# Patient Record
Sex: Female | Born: 1940 | Race: White | Hispanic: No | Smoking: Never smoker
Health system: Southern US, Community
[De-identification: ages and names within clinical notes are randomized; demographics above are authoritative.]

## PROBLEM LIST (undated history)

## (undated) DIAGNOSIS — E785 Hyperlipidemia, unspecified: Secondary | ICD-10-CM

## (undated) DIAGNOSIS — K5790 Diverticulosis of intestine, part unspecified, without perforation or abscess without bleeding: Secondary | ICD-10-CM

## (undated) DIAGNOSIS — K219 Gastro-esophageal reflux disease without esophagitis: Secondary | ICD-10-CM

## (undated) DIAGNOSIS — Z9289 Personal history of other medical treatment: Secondary | ICD-10-CM

## (undated) DIAGNOSIS — E669 Obesity, unspecified: Secondary | ICD-10-CM

## (undated) DIAGNOSIS — E559 Vitamin D deficiency, unspecified: Secondary | ICD-10-CM

## (undated) DIAGNOSIS — N200 Calculus of kidney: Secondary | ICD-10-CM

## (undated) DIAGNOSIS — I10 Essential (primary) hypertension: Secondary | ICD-10-CM

## (undated) DIAGNOSIS — G8929 Other chronic pain: Secondary | ICD-10-CM

## (undated) DIAGNOSIS — M1990 Unspecified osteoarthritis, unspecified site: Secondary | ICD-10-CM

## (undated) DIAGNOSIS — M25569 Pain in unspecified knee: Secondary | ICD-10-CM

## (undated) HISTORY — DX: Diverticulosis of intestine, part unspecified, without perforation or abscess without bleeding: K57.90

## (undated) HISTORY — DX: Personal history of other medical treatment: Z92.89

## (undated) HISTORY — PX: KIDNEY STONE SURGERY: SHX686

## (undated) HISTORY — DX: Hyperlipidemia, unspecified: E78.5

## (undated) HISTORY — PX: OTHER SURGICAL HISTORY: SHX169

## (undated) HISTORY — DX: Vitamin D deficiency, unspecified: E55.9

## (undated) HISTORY — DX: Obesity, unspecified: E66.9

---

## 2005-11-25 ENCOUNTER — Other Ambulatory Visit: Admission: RE | Admit: 2005-11-25 | Discharge: 2005-11-25 | Payer: Self-pay | Admitting: Family Medicine

## 2005-12-07 ENCOUNTER — Encounter: Admission: RE | Admit: 2005-12-07 | Discharge: 2005-12-07 | Payer: Self-pay | Admitting: Family Medicine

## 2006-08-15 DIAGNOSIS — K5790 Diverticulosis of intestine, part unspecified, without perforation or abscess without bleeding: Secondary | ICD-10-CM

## 2006-08-15 HISTORY — DX: Diverticulosis of intestine, part unspecified, without perforation or abscess without bleeding: K57.90

## 2006-12-11 ENCOUNTER — Encounter: Admission: RE | Admit: 2006-12-11 | Discharge: 2006-12-11 | Payer: Self-pay | Admitting: Family Medicine

## 2007-03-07 ENCOUNTER — Other Ambulatory Visit: Admission: RE | Admit: 2007-03-07 | Discharge: 2007-03-07 | Payer: Self-pay | Admitting: Family Medicine

## 2007-03-14 ENCOUNTER — Encounter: Admission: RE | Admit: 2007-03-14 | Discharge: 2007-03-14 | Payer: Self-pay | Admitting: Family Medicine

## 2007-03-26 ENCOUNTER — Ambulatory Visit: Payer: Self-pay | Admitting: Internal Medicine

## 2007-04-10 ENCOUNTER — Ambulatory Visit: Payer: Self-pay | Admitting: Internal Medicine

## 2007-12-20 ENCOUNTER — Encounter: Admission: RE | Admit: 2007-12-20 | Discharge: 2007-12-20 | Payer: Self-pay | Admitting: Family Medicine

## 2008-04-22 ENCOUNTER — Other Ambulatory Visit: Admission: RE | Admit: 2008-04-22 | Discharge: 2008-04-22 | Payer: Self-pay | Admitting: Family Medicine

## 2008-12-22 ENCOUNTER — Encounter: Admission: RE | Admit: 2008-12-22 | Discharge: 2008-12-22 | Payer: Self-pay | Admitting: Family Medicine

## 2009-02-25 ENCOUNTER — Emergency Department (HOSPITAL_COMMUNITY): Admission: EM | Admit: 2009-02-25 | Discharge: 2009-02-25 | Payer: Self-pay | Admitting: Emergency Medicine

## 2009-10-12 ENCOUNTER — Emergency Department (HOSPITAL_BASED_OUTPATIENT_CLINIC_OR_DEPARTMENT_OTHER): Admission: EM | Admit: 2009-10-12 | Discharge: 2009-10-12 | Payer: Self-pay | Admitting: Emergency Medicine

## 2010-01-01 ENCOUNTER — Encounter: Admission: RE | Admit: 2010-01-01 | Discharge: 2010-01-01 | Payer: Self-pay | Admitting: Family Medicine

## 2010-09-05 ENCOUNTER — Encounter: Payer: Self-pay | Admitting: Family Medicine

## 2010-10-14 DIAGNOSIS — Z9289 Personal history of other medical treatment: Secondary | ICD-10-CM

## 2010-10-14 HISTORY — DX: Personal history of other medical treatment: Z92.89

## 2010-10-18 ENCOUNTER — Other Ambulatory Visit: Payer: Self-pay | Admitting: Family Medicine

## 2010-10-18 DIAGNOSIS — Z1231 Encounter for screening mammogram for malignant neoplasm of breast: Secondary | ICD-10-CM

## 2010-11-22 LAB — URINALYSIS, ROUTINE W REFLEX MICROSCOPIC
Bilirubin Urine: NEGATIVE
Ketones, ur: NEGATIVE mg/dL
Nitrite: NEGATIVE
Specific Gravity, Urine: 1.029 (ref 1.005–1.030)
pH: 6.5 (ref 5.0–8.0)

## 2010-11-22 LAB — URINE MICROSCOPIC-ADD ON

## 2011-01-03 ENCOUNTER — Ambulatory Visit: Payer: Self-pay

## 2011-02-21 ENCOUNTER — Other Ambulatory Visit (HOSPITAL_BASED_OUTPATIENT_CLINIC_OR_DEPARTMENT_OTHER): Payer: Self-pay | Admitting: Family Medicine

## 2011-02-21 DIAGNOSIS — M25561 Pain in right knee: Secondary | ICD-10-CM

## 2011-02-23 ENCOUNTER — Ambulatory Visit (HOSPITAL_BASED_OUTPATIENT_CLINIC_OR_DEPARTMENT_OTHER)
Admission: RE | Admit: 2011-02-23 | Discharge: 2011-02-23 | Disposition: A | Discharge status: Home / Self Care | Payer: Medicare Other | Source: Ambulatory Visit | Attending: Family Medicine | Admitting: Family Medicine

## 2011-02-23 DIAGNOSIS — IMO0002 Reserved for concepts with insufficient information to code with codable children: Secondary | ICD-10-CM | POA: Insufficient documentation

## 2011-02-23 DIAGNOSIS — M25469 Effusion, unspecified knee: Secondary | ICD-10-CM

## 2011-02-23 DIAGNOSIS — M25561 Pain in right knee: Secondary | ICD-10-CM

## 2011-02-23 DIAGNOSIS — M25569 Pain in unspecified knee: Secondary | ICD-10-CM | POA: Insufficient documentation

## 2011-02-23 DIAGNOSIS — M23329 Other meniscus derangements, posterior horn of medial meniscus, unspecified knee: Secondary | ICD-10-CM | POA: Insufficient documentation

## 2011-02-23 DIAGNOSIS — M7120 Synovial cyst of popliteal space [Baker], unspecified knee: Secondary | ICD-10-CM

## 2011-02-23 DIAGNOSIS — M1710 Unilateral primary osteoarthritis, unspecified knee: Secondary | ICD-10-CM | POA: Insufficient documentation

## 2011-04-07 ENCOUNTER — Ambulatory Visit
Admission: RE | Admit: 2011-04-07 | Discharge: 2011-04-07 | Disposition: A | Discharge status: Home / Self Care | Payer: Medicare Other | Source: Ambulatory Visit | Attending: Orthopedic Surgery | Admitting: Orthopedic Surgery

## 2011-04-07 ENCOUNTER — Other Ambulatory Visit: Payer: Self-pay | Admitting: Orthopedic Surgery

## 2011-04-07 ENCOUNTER — Encounter (HOSPITAL_BASED_OUTPATIENT_CLINIC_OR_DEPARTMENT_OTHER)
Admission: RE | Admit: 2011-04-07 | Discharge: 2011-04-07 | Disposition: A | Discharge status: Home / Self Care | Payer: Medicare Other | Source: Ambulatory Visit | Attending: Orthopedic Surgery | Admitting: Orthopedic Surgery

## 2011-04-07 DIAGNOSIS — Z01811 Encounter for preprocedural respiratory examination: Secondary | ICD-10-CM

## 2011-04-07 LAB — BASIC METABOLIC PANEL
BUN: 12 mg/dL (ref 6–23)
CO2: 32 mEq/L (ref 19–32)
Creatinine, Ser: 0.67 mg/dL (ref 0.50–1.10)
Glucose, Bld: 99 mg/dL (ref 70–99)
Sodium: 140 mEq/L (ref 135–145)

## 2011-04-08 ENCOUNTER — Ambulatory Visit (HOSPITAL_BASED_OUTPATIENT_CLINIC_OR_DEPARTMENT_OTHER)
Admission: RE | Admit: 2011-04-08 | Discharge: 2011-04-08 | Disposition: A | Discharge status: Home / Self Care | Payer: Medicare Other | Source: Ambulatory Visit | Attending: Orthopedic Surgery | Admitting: Orthopedic Surgery

## 2011-04-08 DIAGNOSIS — Z01818 Encounter for other preprocedural examination: Secondary | ICD-10-CM | POA: Insufficient documentation

## 2011-04-08 DIAGNOSIS — M2240 Chondromalacia patellae, unspecified knee: Secondary | ICD-10-CM | POA: Insufficient documentation

## 2011-04-08 DIAGNOSIS — I10 Essential (primary) hypertension: Secondary | ICD-10-CM | POA: Insufficient documentation

## 2011-04-08 DIAGNOSIS — Z01812 Encounter for preprocedural laboratory examination: Secondary | ICD-10-CM | POA: Insufficient documentation

## 2011-04-08 DIAGNOSIS — M23305 Other meniscus derangements, unspecified medial meniscus, unspecified knee: Secondary | ICD-10-CM | POA: Insufficient documentation

## 2011-04-08 DIAGNOSIS — M6750 Plica syndrome, unspecified knee: Secondary | ICD-10-CM | POA: Insufficient documentation

## 2011-04-08 LAB — POCT HEMOGLOBIN-HEMACUE: Hemoglobin: 15 g/dL (ref 12.0–15.0)

## 2011-04-21 NOTE — Op Note (Signed)
  NAMENESSA, RAMAKER               ACCOUNT NO.:  [id number]  MEDICAL RECORD NO.:  [id number]  LOCATION:                                 FACILITY:  PHYSICIAN:  Harvie Junior, M.D.   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  PROCEDURE: 1. Partial posterior medial meniscectomy with corresponding     debridement of the medial compartment grade 2 and 3 chondral wear. 2. Debridement of chondromalacia patella grade 2 and 3 chondromalacia. 3. Debridement of medial shelf plica.  SURGEON:  Harvie Junior, MD  ASSISTANT:  Marshia Ly, PA  ANESTHESIA:  General.  BRIEF HISTORY:  Ms. Ohagan is a 70 year old female with a long history of having significant right knee pain.  We treated her conservatively for a long period of time with activity modification, injection therapy, anti-inflammatory medications.  After failing all of these, the patient ultimately underwent MRI, which showed that she had a medial meniscal tear, chondromalacia patella, medial plica.  She was brought to the operating room for debridement of these things. At this point, the patient was placed on the operating table in a supine position.  The right leg was prepped and draped in usual sterile fashion.  Following this, routine arthroscopic evaluation of the knee revealed there was an obvious posterior horn medial meniscal tear at the meniscal root with a large radial tear.  This was debrided back to a smooth and stable rim.  I had to take a fair amount of posterior horn of the meniscus to get it back to a stable position.  The medial femoral condyle was then debrided down to a smooth and stable rim.  Attention was then turned to the ACL, normal  lateral side, had a little bit of defect on the tibial plateau.  This was left alone.  Attention was turned to the patellofemoral joint, which was debrided back to smooth and stable rim.  Large medial shelf plica was taken out at this point. The knee was then copiously and thoroughly lavaged, 6 L of normal saline irrigation suctioned.  All sub portals were closed with bandage.  A sterile anchor compressive dressing was applied and the patient was taken to the recovery room and was noted to be in satisfactory condition.  Estimated blood loss for the procedure was none.     Harvie Junior, M.D.     Ranae Plumber  D:  04/08/2011  T:  04/08/2011  Job:  [id number]  Electronically Signed by Jodi Geralds M.D. on 04/21/2011 08:28:19 AM

## 2011-10-09 ENCOUNTER — Other Ambulatory Visit: Payer: Self-pay

## 2011-10-09 ENCOUNTER — Encounter (HOSPITAL_COMMUNITY): Payer: Self-pay | Admitting: Emergency Medicine

## 2011-10-09 ENCOUNTER — Observation Stay (HOSPITAL_COMMUNITY)
Admission: EM | Admit: 2011-10-09 | Discharge: 2011-10-10 | Disposition: A | Discharge status: Home / Self Care | Payer: Medicare Other | Source: Ambulatory Visit | Attending: Internal Medicine | Admitting: Internal Medicine

## 2011-10-09 ENCOUNTER — Emergency Department (HOSPITAL_COMMUNITY): Payer: Medicare Other

## 2011-10-09 DIAGNOSIS — Z87442 Personal history of urinary calculi: Secondary | ICD-10-CM | POA: Insufficient documentation

## 2011-10-09 DIAGNOSIS — E876 Hypokalemia: Secondary | ICD-10-CM | POA: Diagnosis present

## 2011-10-09 DIAGNOSIS — I10 Essential (primary) hypertension: Secondary | ICD-10-CM | POA: Insufficient documentation

## 2011-10-09 DIAGNOSIS — E785 Hyperlipidemia, unspecified: Secondary | ICD-10-CM | POA: Insufficient documentation

## 2011-10-09 DIAGNOSIS — D696 Thrombocytopenia, unspecified: Secondary | ICD-10-CM | POA: Diagnosis present

## 2011-10-09 DIAGNOSIS — R0789 Other chest pain: Principal | ICD-10-CM | POA: Insufficient documentation

## 2011-10-09 DIAGNOSIS — R079 Chest pain, unspecified: Secondary | ICD-10-CM | POA: Diagnosis present

## 2011-10-09 HISTORY — DX: Essential (primary) hypertension: I10

## 2011-10-09 HISTORY — DX: Calculus of kidney: N20.0

## 2011-10-09 LAB — CBC
Hemoglobin: 13.6 g/dL (ref 12.0–15.0)
MCH: 30.4 pg (ref 26.0–34.0)
MCHC: 33.7 g/dL (ref 30.0–36.0)
MCV: 90 fL (ref 78.0–100.0)
RBC: 4.48 MIL/uL (ref 3.87–5.11)
RDW: 12.9 % (ref 11.5–15.5)

## 2011-10-09 LAB — BASIC METABOLIC PANEL
BUN: 11 mg/dL (ref 6–23)
Chloride: 101 mEq/L (ref 96–112)
GFR calc Af Amer: >90 mL/min (ref >90)
GFR calc non Af Amer: 87 mL/min — ABNORMAL LOW (ref >90)
Glucose, Bld: 90 mg/dL (ref 70–99)

## 2011-10-09 MED ORDER — ENOXAPARIN SODIUM 40 MG/0.4ML ~~LOC~~ SOLN
40.0000 mg | SUBCUTANEOUS | Status: DC
  Administered 2011-10-10: 40 mg via SUBCUTANEOUS
  Filled 2011-10-09: qty 0.4

## 2011-10-09 MED ORDER — POTASSIUM CHLORIDE CRYS ER 20 MEQ PO TBCR
40.0000 meq | EXTENDED_RELEASE_TABLET | Freq: Once | ORAL | Status: AC
  Administered 2011-10-09: 40 meq via ORAL
  Filled 2011-10-09: qty 2

## 2011-10-09 MED ORDER — OMEGA-3 FATTY ACIDS 1000 MG PO CAPS: 1.0000 g | ORAL_CAPSULE | Freq: Every day | ORAL | Status: DC

## 2011-10-09 MED ORDER — AMLODIPINE BESYLATE 5 MG PO TABS
5.0000 mg | ORAL_TABLET | Freq: Every day | ORAL | Status: DC
  Administered 2011-10-10: 5 mg via ORAL
  Filled 2011-10-09: qty 1

## 2011-10-09 MED ORDER — ONDANSETRON HCL 4 MG PO TABS: 4.0000 mg | ORAL_TABLET | Freq: Four times a day (QID) | ORAL | Status: DC | PRN

## 2011-10-09 MED ORDER — NITROGLYCERIN 0.4 MG SL SUBL: 0.4000 mg | SUBLINGUAL_TABLET | SUBLINGUAL | Status: DC | PRN

## 2011-10-09 MED ORDER — ATORVASTATIN CALCIUM 10 MG PO TABS
20.0000 mg | ORAL_TABLET | Freq: Every day | ORAL | Status: DC
  Administered 2011-10-10: 20 mg via ORAL
  Filled 2011-10-09: qty 2

## 2011-10-09 MED ORDER — ONDANSETRON HCL 4 MG/2ML IJ SOLN: 4.0000 mg | Freq: Four times a day (QID) | INTRAMUSCULAR | Status: DC | PRN

## 2011-10-09 MED ORDER — ACETAMINOPHEN 650 MG RE SUPP: 650.0000 mg | Freq: Four times a day (QID) | RECTAL | Status: DC | PRN

## 2011-10-09 MED ORDER — OMEGA-3-ACID ETHYL ESTERS 1 G PO CAPS
1.0000 g | ORAL_CAPSULE | Freq: Every day | ORAL | Status: DC
  Administered 2011-10-10: 1 g via ORAL
  Filled 2011-10-09: qty 1

## 2011-10-09 MED ORDER — SODIUM CHLORIDE 0.9 % IJ SOLN
3.0000 mL | Freq: Two times a day (BID) | INTRAMUSCULAR | Status: DC
  Administered 2011-10-10: 3 mL via INTRAVENOUS

## 2011-10-09 MED ORDER — ASPIRIN EC 325 MG PO TBEC
325.0000 mg | DELAYED_RELEASE_TABLET | Freq: Every day | ORAL | Status: DC
  Administered 2011-10-10: 325 mg via ORAL
  Filled 2011-10-09: qty 1

## 2011-10-09 MED ORDER — ALBUTEROL SULFATE (5 MG/ML) 0.5% IN NEBU: 2.5000 mg | INHALATION_SOLUTION | RESPIRATORY_TRACT | Status: DC | PRN

## 2011-10-09 MED ORDER — ACETAMINOPHEN 325 MG PO TABS: 650.0000 mg | ORAL_TABLET | Freq: Four times a day (QID) | ORAL | Status: DC | PRN

## 2011-10-09 MED ORDER — ALUM & MAG HYDROXIDE-SIMETH 200-200-20 MG/5ML PO SUSP: 30.0000 mL | Freq: Four times a day (QID) | ORAL | Status: DC | PRN

## 2011-10-09 NOTE — ED Notes (Signed)
C/o L sided throbbing chest pain since 7am.  States pain was initially intermittent but is now constant.  Denies sob, nausea, vomiting, or any other symptoms.

## 2011-10-09 NOTE — ED Notes (Addendum)
Pt reports she took 81 mg of ASA this morning.  Pt states that she is under increased stress.  Her mother in currently upstairs on Palliative Care and she lost her husband 3 years ago.

## 2011-10-09 NOTE — ED Provider Notes (Signed)
History     CSN: [id number]  Arrival date & time 10/09/11  1524   First MD Initiated Contact with Patient 10/09/11 1548      Chief Complaint  Patient presents with  . Chest Pain    (Consider location/radiation/quality/duration/timing/severity/associated sxs/prior treatment) Patient is a 71 y.o. female presenting with chest pain. The history is provided by the patient.  Chest Pain    patient here complaining of chest pain that began today. Substernal in nature without radiation. Characterized as being achiness. Denies any associated diaphoresis, nausea vomiting, fever. Nothing makes her symptoms better or worse. No prior history of same. Patient had a cardiac stress test last year per her that was negative. She is oriented to her daily aspirin dose denies any leg pain or swelling, pain has been nonpleuritic  Past Medical History  Diagnosis Date  . Hypertension   . High cholesterol     Past Surgical History  Procedure Date  . Kidney stone surgery     No family history on file.  History  Substance Use Topics  . Smoking status: Never Smoker   . Smokeless tobacco: Not on file  . Alcohol Use: Yes     wine    OB History    Grav Para Term Preterm Abortions TAB SAB Ect Mult Living                  Review of Systems  Cardiovascular: Positive for chest pain.  All other systems reviewed and are negative.    Allergies  Review of patient's allergies indicates no known allergies.  Home Medications   Current Outpatient Rx  Name Route Sig Dispense Refill  . AMLODIPINE BESYLATE 5 MG PO TABS Oral Take 5 mg by mouth daily.    . ASPIRIN EC 81 MG PO TBEC Oral Take 81 mg by mouth daily.    [name] VITAMIN D PO Oral Take 1 tablet by mouth daily.    . OMEGA-3 FATTY ACIDS 1000 MG PO CAPS Oral Take 1 g by mouth daily.    [name] ROSUVASTATIN CALCIUM 10 MG PO TABS Oral Take 10 mg by mouth daily.      BP 139/69  Pulse 84  Temp(Src) 98.7 F (37.1 C) (Oral)  Resp 18  SpO2  96%  Physical Exam  Nursing note and vitals reviewed. Constitutional: She is oriented to person, place, and time. She appears well-developed and well-nourished.  Non-toxic appearance. No distress.  HENT:  Head: Normocephalic and atraumatic.  Eyes: Conjunctivae, EOM and lids are normal. Pupils are equal, round, and reactive to light.  Neck: Normal range of motion. Neck supple. No tracheal deviation present. No mass present.  Cardiovascular: Normal rate, regular rhythm and normal heart sounds.  Exam reveals no gallop.   No murmur heard. Pulmonary/Chest: Effort normal and breath sounds normal. No stridor. No respiratory distress. She has no decreased breath sounds. She has no wheezes. She has no rhonchi. She has no rales.  Abdominal: Soft. Normal appearance and bowel sounds are normal. She exhibits no distension. There is no tenderness. There is no rebound and no CVA tenderness.  Musculoskeletal: Normal range of motion. She exhibits no edema and no tenderness.  Neurological: She is alert and oriented to person, place, and time. She has normal strength. No cranial nerve deficit or sensory deficit. GCS eye subscore is 4. GCS verbal subscore is 5. GCS motor subscore is 6.  Skin: Skin is warm and dry. No abrasion and no rash noted.  Psychiatric: She  has a normal mood and affect. Her speech is normal and behavior is normal.    ED Course  Procedures (including critical care time)   Labs Reviewed  CBC  BASIC METABOLIC PANEL  PROTIME-INR  D-DIMER, QUANTITATIVE   No results found.   No diagnosis found.    MDM   Date: 10/09/2011  Rate: 79  Rhythm: normal sinus rhythm  QRS Axis: right  Intervals: normal  ST/T Wave abnormalities: ST depressions inferiorly  Conduction Disutrbances:none  Narrative Interpretation:   Old EKG Reviewed: none available   8:28 PM Patient to be admitted for evaluation of her chest pain.      Toy Baker, MD 10/09/11 2028

## 2011-10-09 NOTE — ED Notes (Signed)
RN attempted to call report to RN on 2000, told by secretary RN will call back to take report in 5 minutes.

## 2011-10-09 NOTE — H&P (Signed)
PCP:   Cynda Familia, MD, MD   Chief Complaint:  Chest pain  HPI: 71 year old Caucasian female, who looks younger than stated age, with history of hypertension, hyperlipidemia, kidney stones was in her usual state of health until approximately 7:30 this morning. She noticed gradual onset of pain in the epigastric region and slightly to the left of the epigastrium underneath the left rib cage. This was initially rated as 6-61/2 over 10 in severity. This was not associated with dyspnea or palpitations or diaphoresis or cough. There was no radiation of the pain. She felt like this was pain of indigestion. There was no associated nausea or vomiting. She denies pleuritic complement to the pain. She then came to the hospital to visit her mother who is admitted to the palliative care unit and the pain got worse and constant. She then presented to the emergency department. Currently she says the pain is much improved and is intermittent and mild and rates it between 3-4/10 in severity. She indicates that she has had a stress test in March of 2012 at her primary cardiologist office which was negative. She has even had a stress test years before which was negative. She indicates that she is extremely stressed because her mother is dying. There is no history of long distance travel or asymmetrical lower extremity swelling.  Past Medical History: Past Medical History  Diagnosis Date  . Hypertension   . High cholesterol   . Kidney stones     Past Surgical History: Past Surgical History  Procedure Date  . Kidney stone surgery     Allergies:  No Known Allergies  Medications: Prior to Admission medications   Medication Sig Start Date End Date Taking? Authorizing Provider  amLODipine (NORVASC) 5 MG tablet Take 5 mg by mouth daily.   Yes Historical Provider, MD  aspirin EC 81 MG tablet Take 81 mg by mouth daily.   Yes Historical Provider, MD  Cholecalciferol (VITAMIN D PO) Take 1 tablet by mouth  daily.   Yes Historical Provider, MD  fish oil-omega-3 fatty acids 1000 MG capsule Take 1 g by mouth daily.   Yes Historical Provider, MD  rosuvastatin (CRESTOR) 10 MG tablet Take 10 mg by mouth daily.   Yes Historical Provider, MD    Family History: Family History  Problem Relation Age of Onset  . Heart disease Mother     Social History:  reports that she has never smoked. She does not have any smokeless tobacco history on file. She reports that she drinks alcohol. She reports that she does not use illicit drugs. she drinks an occasional glass of wine.  Review of Systems:  All systems reviewed and apart from history of presenting illness are negative.  Physical Exam: Filed Vitals:   10/09/11 1900 10/09/11 1930 10/09/11 2030 10/09/11 2100  BP: 119/66 118/62    Pulse: 72 70 75 78  Temp:      TempSrc:      Resp: 11 13 18 16   SpO2: 91% 95% 96% 94%   General appearance: Moderately built and nourished female patient who is lying comfortably in the gurney and is in no obvious distress.  Head: Nontraumatic and normocephalic.  Eyes: Pupils equally reacting to light and accommodation.  Ears: Normal  Nose: No acute findings. No sinus tenderness.  Throat: Mucosa is moist. No oral thrush.  Neck: Supple. No JVD or carotid bruit. Lymph nodes: No lymphadenopathy.  Resp: Clear to auscultation. No increased work of breathing. No reproducible tenderness on  palpation. Cardio: First and second heart sounds heard, regular rate and rythm. No murmurs or JVD or gallop or pedal edema.   GI: Non distended. Soft and nontender. No organomegaly or masses appreciated. Normal bowel sounds heard.  Extremities: symmetric 5/5 power. No acute findings. Skin: No other acute findings.  Neurologic: Alert and oriented. No focal neurological deficits.   Labs on Admission:   [facility] 10/09/11 1556  NA 141  K 3.1*  CL 101  CO2 28  GLUCOSE 90  BUN 11  CREATININE 0.68  CALCIUM 10.5  MG --  PHOS --   No  results found for this basename: AST:2,ALT:2,ALKPHOS:2,BILITOT:2,PROT:2,ALBUMIN:2 in the last 72 hours No results found for this basename: LIPASE:2,AMYLASE:2 in the last 72 hours  Basename 10/09/11 1556  WBC 7.8  NEUTROABS --  HGB 13.6  HCT 40.3  MCV 90.0  PLT 140*   No results found for this basename: CKTOTAL:3,CKMB:3,CKMBINDEX:3,TROPONINI:3 in the last 72 hours No results found for this basename: TSH,T4TOTAL,FREET3,T3FREE,THYROIDAB in the last 72 hours No results found for this basename: VITAMINB12:2,FOLATE:2,FERRITIN:2,TIBC:2,IRON:2,RETICCTPCT:2 in the last 72 hours  Radiological Exams on Admission: Dg Chest 2 View  10/09/2011  *RADIOLOGY REPORT*  Clinical Data: Left chest pain.  Hypertension.  CHEST - 2 VIEW  Comparison: 04/07/2011  Findings: Stable mild perihilar scarring noted along with thoracic spondylosis.  The lungs appear otherwise clear.  No pleural effusion noted. Heart size is in the upper normal range.  IMPRESSION:  1.  Stable bilateral perihilar scarring. 2.  Thoracic spondylosis.  Original Report Authenticated By: Dellia Cloud, M.D.   EKG: Normal sinus rhythm at 79 beats per minute, rightward axis, nonspecific ST and T wave abnormalities but no acute changes.   Assessment/Plan Present on Admission:  .Chest pain .Hypokalemia .Thrombocytopenia  1. Atypical chest pain: Rule out acute coronary syndrome. Admit to telemetry for 23 hour observation. Cycle cardiac enzymes. Consult [facility] tomorrow regarding further evaluation viz, a repeat stress test versus coronary angiogram versus no further workup. Aspirin 325 mg daily. 2. Hypokalemia: Unclear etiology. Patient is not on any diuretics. Repleted in the ED. Followup BMP and magnesium tomorrow. 3. Thrombocytopenia: Unclear etiology. No prior blood results to compare. Follow CBCs tomorrow. 4. Hypertension: Controlled. 5. Hyperlipidemia: Statins. 6. Full code.   Riona Lahti 10/09/2011,  9:40 PM

## 2011-10-10 ENCOUNTER — Other Ambulatory Visit: Payer: Self-pay

## 2011-10-10 LAB — MAGNESIUM: Magnesium: 2.1 mg/dL (ref 1.5–2.5)

## 2011-10-10 LAB — CBC
HCT: 41.8 % (ref 36.0–46.0)
MCHC: 34.4 g/dL (ref 30.0–36.0)
RDW: 13.2 % (ref 11.5–15.5)

## 2011-10-10 LAB — BASIC METABOLIC PANEL
Calcium: 10 mg/dL (ref 8.4–10.5)
Creatinine, Ser: 0.75 mg/dL (ref 0.50–1.10)
GFR calc Af Amer: >90 mL/min (ref >90)
GFR calc non Af Amer: 84 mL/min — ABNORMAL LOW (ref >90)
Sodium: 143 mEq/L (ref 135–145)

## 2011-10-10 LAB — CARDIAC PANEL(CRET KIN+CKTOT+MB+TROPI)
Relative Index: 1 (ref 0.0–2.5)
Relative Index: 1.4 (ref 0.0–2.5)
Total CK: 207 U/L — ABNORMAL HIGH (ref 7–177)
Total CK: 140 U/L (ref 7–177)

## 2011-10-10 MED ORDER — NITROGLYCERIN 0.4 MG SL SUBL: 0.4000 mg | SUBLINGUAL_TABLET | SUBLINGUAL | Status: DC | PRN

## 2011-10-10 MED ORDER — OMEGA-3-ACID ETHYL ESTERS 1 G PO CAPS: 1.0000 g | ORAL_CAPSULE | Freq: Every day | ORAL | Status: DC

## 2011-10-10 NOTE — Progress Notes (Signed)
Subjective: No further episodes of chest pain No SOB No nausea, no vomiting   Objective: Vital signs in last 24 hours: Filed Vitals:   10/09/11 2130 10/09/11 2231 10/10/11 0531 10/10/11 1021  BP:  120/80 108/72 110/69  Pulse: 77 85 77   Temp:  98.7 F (37.1 C) 98.5 F (36.9 C)   TempSrc:  Oral Oral   Resp: 21 20 19    Height:  5\' 2"  (1.575 m)    Weight:  76.522 kg (168 lb 11.2 oz)    SpO2: 91% 93% 94%    Weight change:   Intake/Output Summary (Last 24 hours) at 10/10/11 1253 Last data filed at 10/10/11 1021  Gross per 24 hour  Intake    243 ml  Output    900 ml  Net   -657 ml    Physical Exam: General: Awake, Oriented, No acute distress. HEENT: EOMI. Neck: Supple CV: S1 and S2, RRR Lungs: Clear to ascultation bilaterally, no wheezing Abdomen: Soft, Nontender, Nondistended, +bowel sounds. Ext: Good pulses. Trace edema.   Lab Results:  [facility] 10/10/11 0610 10/09/11 1556  NA 143 141  K 4.0 3.1*  CL 104 101  CO2 31 28  GLUCOSE 87 90  BUN 11 11  CREATININE 0.75 0.68  CALCIUM 10.0 10.5  MG 2.1 --  PHOS -- --   No results found for this basename: AST:2,ALT:2,ALKPHOS:2,BILITOT:2,PROT:2,ALBUMIN:2 in the last 72 hours No results found for this basename: LIPASE:2,AMYLASE:2 in the last 72 hours  Basename 10/10/11 0610 10/09/11 1556  WBC 6.6 7.8  NEUTROABS -- --  HGB 14.4 13.6  HCT 41.8 40.3  MCV 91.7 90.0  PLT 151 140*    Basename 10/10/11 0610 10/09/11 2227  CKTOTAL 140 207*  CKMB 1.9 2.1  CKMBINDEX -- --  TROPONINI <0.30 <0.30   No components found with this basename: POCBNP:3  Basename 10/09/11 1556  DDIMER 0.39   No results found for this basename: HGBA1C:2 in the last 72 hours No results found for this basename: CHOL:2,HDL:2,LDLCALC:2,TRIG:2,CHOLHDL:2,LDLDIRECT:2 in the last 72 hours No results found for this basename: TSH,T4TOTAL,FREET3,T3FREE,THYROIDAB in the last 72 hours No results found for this basename:  VITAMINB12:2,FOLATE:2,FERRITIN:2,TIBC:2,IRON:2,RETICCTPCT:2 in the last 72 hours  Micro Results: No results found for this or any previous visit (from the past 240 hour(s)).  Studies/Results: Dg Chest 2 View  10/09/2011  *RADIOLOGY REPORT*  Clinical Data: Left chest pain.  Hypertension.  CHEST - 2 VIEW  Comparison: 04/07/2011  Findings: Stable mild perihilar scarring noted along with thoracic spondylosis.  The lungs appear otherwise clear.  No pleural effusion noted. Heart size is in the upper normal range.  IMPRESSION:  1.  Stable bilateral perihilar scarring. 2.  Thoracic spondylosis.  Original Report Authenticated By: Dellia Cloud, M.D.    Medications: I have reviewed the patient's current medications. Scheduled Meds:   . amLODipine  5 mg Oral Daily  . aspirin EC  325 mg Oral Daily  . atorvastatin  20 mg Oral q1800  . enoxaparin  40 mg Subcutaneous Q24H  . omega-3 acid ethyl esters  1 g Oral Daily  . potassium chloride  40 mEq Oral Once  . sodium chloride  3 mL Intravenous Q12H  . DISCONTD: fish oil-omega-3 fatty acids  1 g Oral Daily   Continuous Infusions:  PRN Meds:.acetaminophen, acetaminophen, albuterol, alum & mag hydroxide-simeth, nitroGLYCERIN, ondansetron (ZOFRAN) IV, ondansetron  Assessment/Plan:   *Chest pain- CE negative,    Hypokalemia-repleated   Thrombocytopenia- stable  HTN HLD  Hope for  D/C soon if no intervention by cardiology    LOS: 1 day  [name] Marcum, DO 10/10/2011, 12:53 PM

## 2011-10-10 NOTE — Progress Notes (Signed)
Utilization review complete 

## 2011-10-10 NOTE — Progress Notes (Signed)
1850 - pt d/c home with instructions, r/x, and f/u appointment with PCP. Pt verbalized understanding of instructions, home with family.  [name] [name] 10/10/2011

## 2011-10-10 NOTE — Discharge Summary (Signed)
Discharge Summary  [name] [name] MR#: [id number]  DOB:05-Jan-1941  Date of Admission: 10/09/2011 Date of Discharge: 10/10/2011  Patient's PCP: Cynda Familia, MD, MD  Attending Physician:Judah Chevere  Consults: Treatment Team:  Runell Gess, MD Thurmon Fair, MD   Discharge Diagnoses:  *Chest pain  Hypokalemia  Thrombocytopenia   Brief Admitting History and Physical 71 year old Caucasian female, who looks younger than stated age, with history of hypertension, hyperlipidemia, kidney stones was in her usual state of health until approximately 7:30 this morning. She noticed gradual onset of pain in the epigastric region and slightly to the left of the epigastrium underneath the left rib cage. This was initially rated as 6-61/2 over 10 in severity. This was not associated with dyspnea or palpitations or diaphoresis or cough. There was no radiation of the pain. She felt like this was pain of indigestion. There was no associated nausea or vomiting. She denies pleuritic complement to the pain. She then came to the hospital to visit her mother who is admitted to the palliative care unit and the pain got worse and constant. She then presented to the emergency department. Currently she says the pain is much improved and is intermittent and mild and rates it between 3-4/10 in severity. She indicates that she has had a stress test in March of 2012 at her primary cardiologist office which was negative. She has even had a stress test years before which was negative. She indicates that she is extremely stressed because her mother is dying. There is no history of long distance travel or asymmetrical lower extremity swelling.   Discharge Medications Medication List  As of 10/10/2011  3:58 PM   TAKE these medications         amLODipine 5 MG tablet   Commonly known as: NORVASC   Take 5 mg by mouth daily.      aspirin EC 81 MG tablet   Take 81 mg by mouth daily.      fish oil-omega-3 fatty  acids 1000 MG capsule   Take 1 g by mouth daily.      nitroGLYCERIN 0.4 MG SL tablet   Commonly known as: NITROSTAT   Place 1 tablet (0.4 mg total) under the tongue every 5 (five) minutes x 3 doses as needed for chest pain.      omega-3 acid ethyl esters 1 G capsule   Commonly known as: LOVAZA   Take 1 capsule (1 g total) by mouth daily.      rosuvastatin 10 MG tablet   Commonly known as: CRESTOR   Take 10 mg by mouth daily.      VITAMIN D PO   Take 1 tablet by mouth daily.            Hospital Course: Chest pain- ? Related to stress, seen by cardiology- recent echo and ST normal, plan to D/C home with follow up by PCP/cardiologist Hypokalemia- repleated     Day of Discharge BP 107/65  Pulse 82  Temp(Src) 98.9 F (37.2 C) (Oral)  Resp 18  Ht 5\' 2"  (1.575 m)  Wt 76.522 kg (168 lb 11.2 oz)  BMI 30.86 kg/m2  SpO2 93%  Results for orders placed during the hospital encounter of 10/09/11 (from the past 48 hour(s))  CBC     Status: Abnormal   Collection Time   10/09/11  3:56 PM      Component Value Range Comment   WBC 7.8  4.0 - 10.5 (K/uL)    RBC 4.48  3.87 - 5.11 (MIL/uL)    Hemoglobin 13.6  12.0 - 15.0 (g/dL)    HCT 16.1  09.6 - 04.5 (%)    MCV 90.0  78.0 - 100.0 (fL)    MCH 30.4  26.0 - 34.0 (pg)    MCHC 33.7  30.0 - 36.0 (g/dL)    RDW 40.9  81.1 - 91.4 (%)    Platelets 140 (*) 150 - 400 (K/uL)   BASIC METABOLIC PANEL     Status: Abnormal   Collection Time   10/09/11  3:56 PM      Component Value Range Comment   Sodium 141  135 - 145 (mEq/L)    Potassium 3.1 (*) 3.5 - 5.1 (mEq/L)    Chloride 101  96 - 112 (mEq/L)    CO2 28  19 - 32 (mEq/L)    Glucose, Bld 90  70 - 99 (mg/dL)    BUN 11  6 - 23 (mg/dL)    Creatinine, Ser 7.82  0.50 - 1.10 (mg/dL)    Calcium 95.6  8.4 - 10.5 (mg/dL)    GFR calc non Af Amer 87 (*) >90 (mL/min)    GFR calc Af Amer >90  >90 (mL/min)   PROTIME-INR     Status: Normal   Collection Time   10/09/11  3:56 PM      Component Value  Range Comment   Prothrombin Time 14.2  11.6 - 15.2 (seconds)    INR 1.08  0.00 - 1.49    D-DIMER, QUANTITATIVE     Status: Normal   Collection Time   10/09/11  3:56 PM      Component Value Range Comment   D-Dimer, Quant 0.39  0.00 - 0.48 (ug/mL-FEU)   POCT I-STAT TROPONIN I     Status: Normal   Collection Time   10/09/11  4:27 PM      Component Value Range Comment   Troponin i, poc 0.00  0.00 - 0.08 (ng/mL)    Comment 3            CARDIAC PANEL(CRET KIN+CKTOT+MB+TROPI)     Status: Abnormal   Collection Time   10/09/11 10:27 PM      Component Value Range Comment   Total CK 207 (*) 7 - 177 (U/L)    CK, MB 2.1  0.3 - 4.0 (ng/mL)    Troponin I <0.30  <0.30 (ng/mL)    Relative Index 1.0  0.0 - 2.5    CARDIAC PANEL(CRET KIN+CKTOT+MB+TROPI)     Status: Normal   Collection Time   10/10/11  6:10 AM      Component Value Range Comment   Total CK 140  7 - 177 (U/L)    CK, MB 1.9  0.3 - 4.0 (ng/mL)    Troponin I <0.30  <0.30 (ng/mL)    Relative Index 1.4  0.0 - 2.5    CBC     Status: Normal   Collection Time   10/10/11  6:10 AM      Component Value Range Comment   WBC 6.6  4.0 - 10.5 (K/uL)    RBC 4.56  3.87 - 5.11 (MIL/uL)    Hemoglobin 14.4  12.0 - 15.0 (g/dL)    HCT 21.3  08.6 - 57.8 (%)    MCV 91.7  78.0 - 100.0 (fL)    MCH 31.6  26.0 - 34.0 (pg)    MCHC 34.4  30.0 - 36.0 (g/dL)    RDW 46.9  62.9 - 52.8 (%)  Platelets 151  150 - 400 (K/uL)   BASIC METABOLIC PANEL     Status: Abnormal   Collection Time   10/10/11  6:10 AM      Component Value Range Comment   Sodium 143  135 - 145 (mEq/L)    Potassium 4.0  3.5 - 5.1 (mEq/L)    Chloride 104  96 - 112 (mEq/L)    CO2 31  19 - 32 (mEq/L)    Glucose, Bld 87  70 - 99 (mg/dL)    BUN 11  6 - 23 (mg/dL)    Creatinine, Ser 1.61  0.50 - 1.10 (mg/dL)    Calcium 09.6  8.4 - 10.5 (mg/dL)    GFR calc non Af Amer 84 (*) >90 (mL/min)    GFR calc Af Amer >90  >90 (mL/min)   MAGNESIUM     Status: Normal   Collection Time   10/10/11  6:10 AM        Component Value Range Comment   Magnesium 2.1  1.5 - 2.5 (mg/dL)     Dg Chest 2 View  0/45/4098  *RADIOLOGY REPORT*  Clinical Data: Left chest pain.  Hypertension.  CHEST - 2 VIEW  Comparison: 04/07/2011  Findings: Stable mild perihilar scarring noted along with thoracic spondylosis.  The lungs appear otherwise clear.  No pleural effusion noted. Heart size is in the upper normal range.  IMPRESSION:  1.  Stable bilateral perihilar scarring. 2.  Thoracic spondylosis.  Original Report Authenticated By: Dellia Cloud, M.D.     Disposition: home  Diet: cardiac  Activity: as tolerated  Follow-up Appts: PCP in 1 week  Discharge Orders    Future Orders Please Complete By Expires   Diet - low sodium heart healthy      Increase activity slowly          Time spent on discharge, talking to the patient, and coordinating care: 32 mins.   SignedMarlin Canary, DO 10/10/2011, 3:58 PM

## 2011-10-10 NOTE — Consult Note (Signed)
Reason for Consult: Chest Pain Referring Physician:   [name] is an 71 y.o. female.  HPI: patient is a 71 year old female with a history of hypertension hyperlipidemia and obesity. She is a patient [facility]. She was last seen on 11/29/2010. She had a nuclear stress test in March of 2012 which revealed normal perfusion. Shows a 2-D echocardiogram showed normal systolic function normal diastolic function. Was mild mitral annular calcification with mild MR and mild TR.  Patient states that yesterday morning she developed "indigestion". She continued on to church and after church was visiting her mother up and out of medicine. This indigestion/pain continued with 6-1/2-7/10 in intensity. It was not sharp she describes it "as annoying". She took several TUMS with no relief. She denies any radiation of this pain, any nausea, vomiting, diaphoresis, lower extremity edema, orthopnea, PND, abdominal pain.  Is epigastric/chest pain is resolved currently. She does report that last week she had one episode under her left breast of feeling like "something jumping around".  She just reports that she has started a senior exercise program which she does to the television. This lasts one hour and includes cardiovascular work and weights with dumbbells.  At no time last week did she have any chest pain associated during these exercise periods.  EKG shows inverted T waves V1 and 2.  Past Medical History  Diagnosis Date  . Hypertension   . High cholesterol   . Kidney stones     Past Surgical History  Procedure Date  . Kidney stone surgery   . Orthroscopic rt knee     Family History  Problem Relation Age of Onset  . Heart disease Mother     Social History:  reports that she has never smoked. She has never used smokeless tobacco. She reports that she drinks alcohol. She reports that she does not use illicit drugs.  Allergies: No Known Allergies  Medications:     . amLODipine  5  mg Oral Daily  . aspirin EC  325 mg Oral Daily  . atorvastatin  20 mg Oral q1800  . enoxaparin  40 mg Subcutaneous Q24H  . omega-3 acid ethyl esters  1 g Oral Daily  . potassium chloride  40 mEq Oral Once  . sodium chloride  3 mL Intravenous Q12H  . DISCONTD: fish oil-omega-3 fatty acids  1 g Oral Daily     Results for orders placed during the hospital encounter of 10/09/11 (from the past 48 hour(s))  CBC     Status: Abnormal   Collection Time   10/09/11  3:56 PM      Component Value Range Comment   WBC 7.8  4.0 - 10.5 (K/uL)    RBC 4.48  3.87 - 5.11 (MIL/uL)    Hemoglobin 13.6  12.0 - 15.0 (g/dL)    HCT 09.8  11.9 - 14.7 (%)    MCV 90.0  78.0 - 100.0 (fL)    MCH 30.4  26.0 - 34.0 (pg)    MCHC 33.7  30.0 - 36.0 (g/dL)    RDW 82.9  56.2 - 13.0 (%)    Platelets 140 (*) 150 - 400 (K/uL)   BASIC METABOLIC PANEL     Status: Abnormal   Collection Time   10/09/11  3:56 PM      Component Value Range Comment   Sodium 141  135 - 145 (mEq/L)    Potassium 3.1 (*) 3.5 - 5.1 (mEq/L)    Chloride 101  96 -  112 (mEq/L)    CO2 28  19 - 32 (mEq/L)    Glucose, Bld 90  70 - 99 (mg/dL)    BUN 11  6 - 23 (mg/dL)    Creatinine, Ser 7.84  0.50 - 1.10 (mg/dL)    Calcium 69.6  8.4 - 10.5 (mg/dL)    GFR calc non Af Amer 87 (*) >90 (mL/min)    GFR calc Af Amer >90  >90 (mL/min)   PROTIME-INR     Status: Normal   Collection Time   10/09/11  3:56 PM      Component Value Range Comment   Prothrombin Time 14.2  11.6 - 15.2 (seconds)    INR 1.08  0.00 - 1.49    D-DIMER, QUANTITATIVE     Status: Normal   Collection Time   10/09/11  3:56 PM      Component Value Range Comment   D-Dimer, Quant 0.39  0.00 - 0.48 (ug/mL-FEU)   POCT I-STAT TROPONIN I     Status: Normal   Collection Time   10/09/11  4:27 PM      Component Value Range Comment   Troponin i, poc 0.00  0.00 - 0.08 (ng/mL)    Comment 3            CARDIAC PANEL(CRET KIN+CKTOT+MB+TROPI)     Status: Abnormal   Collection Time   10/09/11 10:27 PM       Component Value Range Comment   Total CK 207 (*) 7 - 177 (U/L)    CK, MB 2.1  0.3 - 4.0 (ng/mL)    Troponin I <0.30  <0.30 (ng/mL)    Relative Index 1.0  0.0 - 2.5    CARDIAC PANEL(CRET KIN+CKTOT+MB+TROPI)     Status: Normal   Collection Time   10/10/11  6:10 AM      Component Value Range Comment   Total CK 140  7 - 177 (U/L)    CK, MB 1.9  0.3 - 4.0 (ng/mL)    Troponin I <0.30  <0.30 (ng/mL)    Relative Index 1.4  0.0 - 2.5    CBC     Status: Normal   Collection Time   10/10/11  6:10 AM      Component Value Range Comment   WBC 6.6  4.0 - 10.5 (K/uL)    RBC 4.56  3.87 - 5.11 (MIL/uL)    Hemoglobin 14.4  12.0 - 15.0 (g/dL)    HCT 29.5  28.4 - 13.2 (%)    MCV 91.7  78.0 - 100.0 (fL)    MCH 31.6  26.0 - 34.0 (pg)    MCHC 34.4  30.0 - 36.0 (g/dL)    RDW 44.0  10.2 - 72.5 (%)    Platelets 151  150 - 400 (K/uL)   BASIC METABOLIC PANEL     Status: Abnormal   Collection Time   10/10/11  6:10 AM      Component Value Range Comment   Sodium 143  135 - 145 (mEq/L)    Potassium 4.0  3.5 - 5.1 (mEq/L)    Chloride 104  96 - 112 (mEq/L)    CO2 31  19 - 32 (mEq/L)    Glucose, Bld 87  70 - 99 (mg/dL)    BUN 11  6 - 23 (mg/dL)    Creatinine, Ser 3.66  0.50 - 1.10 (mg/dL)    Calcium 44.0  8.4 - 10.5 (mg/dL)    GFR calc non Af Amer 84 (*) >  90 (mL/min)    GFR calc Af Amer >90  >90 (mL/min)   MAGNESIUM     Status: Normal   Collection Time   10/10/11  6:10 AM      Component Value Range Comment   Magnesium 2.1  1.5 - 2.5 (mg/dL)     Dg Chest 2 View  4/54/0981  *RADIOLOGY REPORT*  Clinical Data: Left chest pain.  Hypertension.  CHEST - 2 VIEW  Comparison: 04/07/2011  Findings: Stable mild perihilar scarring noted along with thoracic spondylosis.  The lungs appear otherwise clear.  No pleural effusion noted. Heart size is in the upper normal range.  IMPRESSION:  1.  Stable bilateral perihilar scarring. 2.  Thoracic spondylosis.  Original Report Authenticated By: Dellia Cloud, M.D.     Review of Systems  Constitutional: Negative for fever and diaphoresis.  HENT: Negative for congestion and neck pain.   Eyes: Negative for blurred vision and double vision.  Respiratory: Negative for cough and shortness of breath.   Cardiovascular: Positive for chest pain (However it is resolved.). Negative for palpitations, orthopnea, leg swelling and PND.  Gastrointestinal: Negative for nausea, vomiting, abdominal pain, diarrhea, constipation, blood in stool and melena.  Genitourinary: Negative for dysuria and hematuria.  Musculoskeletal: Negative for back pain.  Neurological: Negative for dizziness, weakness and headaches.   Blood pressure 110/69, pulse 77, temperature 98.5 F (36.9 C), temperature source Oral, resp. rate 19, height 5\' 2"  (1.575 m), weight 76.522 kg (168 lb 11.2 oz), SpO2 94.00%. Physical Exam  Constitutional: She is oriented to person, place, and time. She appears well-nourished. No distress.       Obese  HENT:  Head: Normocephalic and atraumatic.  Eyes: Conjunctivae and EOM are normal. Pupils are equal, round, and reactive to light. Left eye exhibits no discharge. No scleral icterus.  Neck: Neck supple. No JVD present.  Cardiovascular: Normal rate and regular rhythm.  Exam reveals no friction rub.   No murmur heard. Respiratory: Effort normal and breath sounds normal. She has no wheezes. She has no rales.  GI: Soft. Bowel sounds are normal. There is no tenderness.  Musculoskeletal: She exhibits no edema.  Lymphadenopathy:    She has no cervical adenopathy.  Neurological: She is alert and oriented to person, place, and time. She exhibits normal muscle tone.  Skin: Skin is warm and dry.  Psychiatric: She has a normal mood and affect.    Assessment/Plan: Patient Active Hospital Problem List: Chest pain (10/09/2011) Hypokalemia (10/09/2011) Thrombocytopenia (10/09/2011)  Plan: Patient is a 71 year old Caucasian female with mild obesity, hypertension,  hyperlipidemia who in March of 2012 normal stress test and essentially normal 2-D echocardiogram. She presented with epigastric pain/chest pain.  Cardiac enzymes and a negative. EKG shows inverted T waves in V1 and V2 only.she is currently pain-free.  The pressures were well-controlled at 107/65 with a heart rate of 82 beats per minute.  Potassium has been repleted.  I believe this chest/epigastric pain is most likely noncardiac given normal exercise Myoview within the last 12 months and essentially normal 2-D echocardiogram.  She'll likely be discharged home later today   HAGER,BRYAN W 10/10/2011, 1:11 PM    Patient seen and examined. Agree with assessment and plan. Pt currently is asymptomatic.  She has been under increased stress with mother in palliative care. Question musculoskeletal discomfort with nl CKMB but minimal total CK increase x1. Suspect noncardiac cp.  ECG with non-diagnostic T changes V1-2.  Ok to send home today with ov f/u.  Lennette Bihari, MD, [facility] 10/10/2011 6:20 PM

## 2011-10-11 NOTE — Progress Notes (Signed)
   CARE MANAGEMENT NOTE 10/11/2011  Patient:  [name] [name]   Account Number:  [id number]  Date Initiated:  10/11/2011  Documentation initiated by:  Letha Cape  Subjective/Objective Assessment:   dx chest pain  admit as observation     Action/Plan:   Anticipated DC Date:  10/10/2011   Anticipated DC Plan:  HOME/SELF CARE      DC Planning Services  CM consult      Choice offered to / List presented to:             Status of service:  Completed, signed off Medicare Important Message given?   (If response is "NO", the following Medicare IM given date fields will be blank) Date Medicare IM given:   Date Additional Medicare IM given:    Discharge Disposition:  HOME/SELF CARE  Per UR Regulation:    Comments:  PCP [name] [name]  10/11/11 9:07 Letha Cape RN, BSN [phone] patient lives alone, pta independent.  Patient for dc today to home.

## 2011-12-05 ENCOUNTER — Other Ambulatory Visit: Payer: Self-pay | Admitting: Family Medicine

## 2011-12-05 DIAGNOSIS — Z1231 Encounter for screening mammogram for malignant neoplasm of breast: Secondary | ICD-10-CM

## 2011-12-27 ENCOUNTER — Ambulatory Visit
Admission: RE | Admit: 2011-12-27 | Discharge: 2011-12-27 | Disposition: A | Discharge status: Home / Self Care | Payer: Medicare Other | Source: Ambulatory Visit | Attending: Family Medicine | Admitting: Family Medicine

## 2011-12-27 DIAGNOSIS — Z1231 Encounter for screening mammogram for malignant neoplasm of breast: Secondary | ICD-10-CM

## 2012-02-07 ENCOUNTER — Emergency Department (HOSPITAL_BASED_OUTPATIENT_CLINIC_OR_DEPARTMENT_OTHER)
Admission: EM | Admit: 2012-02-07 | Discharge: 2012-02-07 | Disposition: A | Discharge status: Home / Self Care | Payer: Medicare Other | Attending: Emergency Medicine | Admitting: Emergency Medicine

## 2012-02-07 ENCOUNTER — Encounter (HOSPITAL_BASED_OUTPATIENT_CLINIC_OR_DEPARTMENT_OTHER): Payer: Self-pay | Admitting: *Deleted

## 2012-02-07 ENCOUNTER — Emergency Department (HOSPITAL_BASED_OUTPATIENT_CLINIC_OR_DEPARTMENT_OTHER): Payer: Medicare Other

## 2012-02-07 DIAGNOSIS — E876 Hypokalemia: Secondary | ICD-10-CM | POA: Insufficient documentation

## 2012-02-07 DIAGNOSIS — I10 Essential (primary) hypertension: Secondary | ICD-10-CM | POA: Insufficient documentation

## 2012-02-07 DIAGNOSIS — R0989 Other specified symptoms and signs involving the circulatory and respiratory systems: Secondary | ICD-10-CM | POA: Insufficient documentation

## 2012-02-07 DIAGNOSIS — Z7982 Long term (current) use of aspirin: Secondary | ICD-10-CM | POA: Insufficient documentation

## 2012-02-07 DIAGNOSIS — Z79899 Other long term (current) drug therapy: Secondary | ICD-10-CM | POA: Insufficient documentation

## 2012-02-07 DIAGNOSIS — R42 Dizziness and giddiness: Secondary | ICD-10-CM | POA: Insufficient documentation

## 2012-02-07 DIAGNOSIS — E7800 Pure hypercholesterolemia, unspecified: Secondary | ICD-10-CM | POA: Insufficient documentation

## 2012-02-07 DIAGNOSIS — Z87442 Personal history of urinary calculi: Secondary | ICD-10-CM | POA: Insufficient documentation

## 2012-02-07 LAB — BASIC METABOLIC PANEL
CO2: 30 mEq/L (ref 19–32)
Calcium: 9.7 mg/dL (ref 8.4–10.5)
Creatinine, Ser: 0.7 mg/dL (ref 0.50–1.10)
GFR calc non Af Amer: 85 mL/min — ABNORMAL LOW (ref >90)
Sodium: 141 mEq/L (ref 135–145)

## 2012-02-07 LAB — DIFFERENTIAL
Basophils Absolute: 0 10*3/uL (ref 0.0–0.1)
Eosinophils Absolute: 0.1 10*3/uL (ref 0.0–0.7)
Eosinophils Relative: 2 % (ref 0–5)
Lymphocytes Relative: 27 % (ref 12–46)
Lymphs Abs: 2.3 10*3/uL (ref 0.7–4.0)
Neutrophils Relative %: 64 % (ref 43–77)

## 2012-02-07 LAB — CBC
MCV: 89.4 fL (ref 78.0–100.0)
Platelets: 151 10*3/uL (ref 150–400)
RBC: 4.61 MIL/uL (ref 3.87–5.11)
RDW: 12.8 % (ref 11.5–15.5)
WBC: 8.6 10*3/uL (ref 4.0–10.5)

## 2012-02-07 LAB — CARDIAC PANEL(CRET KIN+CKTOT+MB+TROPI)
CK, MB: 3.6 ng/mL (ref 0.3–4.0)
Relative Index: 1.3 (ref 0.0–2.5)
Relative Index: 1.3 (ref 0.0–2.5)
Total CK: 269 U/L — ABNORMAL HIGH (ref 7–177)

## 2012-02-07 MED ORDER — POTASSIUM CHLORIDE CRYS ER 20 MEQ PO TBCR
40.0000 meq | EXTENDED_RELEASE_TABLET | Freq: Once | ORAL | Status: AC
  Administered 2012-02-07: 40 meq via ORAL
  Filled 2012-02-07 (×2): qty 2

## 2012-02-07 NOTE — ED Provider Notes (Addendum)
History     CSN: [id number]  Arrival date & time 02/07/12  1857   First MD Initiated Contact with Patient 02/07/12 1931      Chief Complaint  Patient presents with  . Dizziness    (Consider location/radiation/quality/duration/timing/severity/associated sxs/prior treatment) Patient is a 71 y.o. female presenting with weakness. The history is provided by the patient.  Weakness The primary symptoms include dizziness. Primary symptoms do not include syncope, visual change, paresthesias, focal weakness, speech change, fever, nausea or vomiting. The symptoms began less than 1 hour ago (first episode yesterday and then another episode today). The episode lasted 1 minute. The symptoms are resolved. The neurological symptoms are diffuse. Context: one episode while standing the other while sitting.  Dizziness does not occur with nausea, vomiting or weakness.  Additional symptoms do not include weakness, loss of balance, anxiety or irritability. Medical issues also include hypertension. Medical issues do not include diabetes.    Past Medical History  Diagnosis Date  . Hypertension   . High cholesterol   . Kidney stones     Past Surgical History  Procedure Date  . Kidney stone surgery   . Orthroscopic rt knee     Family History  Problem Relation Age of Onset  . Heart disease Mother     History  Substance Use Topics  . Smoking status: Never Smoker   . Smokeless tobacco: Never Used  . Alcohol Use: Yes     wine    OB History    Grav Para Term Preterm Abortions TAB SAB Ect Mult Living                  Review of Systems  Constitutional: Negative for fever and irritability.  Cardiovascular: Negative for syncope.  Gastrointestinal: Negative for nausea and vomiting.  Neurological: Positive for dizziness. Negative for speech change, focal weakness, weakness, paresthesias and loss of balance.  All other systems reviewed and are negative.    Allergies  Review of patient's  allergies indicates no known allergies.  Home Medications   Current Outpatient Rx  Name Route Sig Dispense Refill  . AMLODIPINE BESYLATE 5 MG PO TABS Oral Take 5 mg by mouth daily.    [name] VITAMIN D PO Oral Take 1 tablet by mouth daily.    [name] HYDROCHLOROTHIAZIDE 25 MG PO TABS Oral Take 25 mg by mouth daily.    [name] NITROGLYCERIN 0.4 MG SL SUBL Sublingual Place 0.4 mg under the tongue every 5 (five) minutes x 3 doses as needed. Patient hasn't taken this medication in the last 30 days.    [name] ROSUVASTATIN CALCIUM 10 MG PO TABS Oral Take 10 mg by mouth daily.    . ASPIRIN EC 81 MG PO TBEC Oral Take 81 mg by mouth daily.      BP 117/68  Pulse 91  Temp 98.2 F (36.8 C) (Oral)  Resp 18  SpO2 96%  Physical Exam  Nursing note and vitals reviewed. Constitutional: She is oriented to person, place, and time. She appears well-developed and well-nourished. No distress.  HENT:  Head: Normocephalic and atraumatic.  Eyes: EOM are normal. Pupils are equal, round, and reactive to light.  Neck: Normal range of motion. Neck supple. Carotid bruit is present.  Cardiovascular: Normal rate, regular rhythm, normal heart sounds and intact distal pulses.  Exam reveals no friction rub.   No murmur heard. Pulmonary/Chest: Effort normal and breath sounds normal. She has no wheezes. She has no rales.  Abdominal: Soft. Bowel sounds are  normal. She exhibits no distension. There is no tenderness. There is no rebound and no guarding.  Musculoskeletal: Normal range of motion. She exhibits no tenderness.       No edema  Lymphadenopathy:    She has no cervical adenopathy.  Neurological: She is alert and oriented to person, place, and time. No cranial nerve deficit.  Skin: Skin is warm and dry. No rash noted.  Psychiatric: She has a normal mood and affect. Her behavior is normal.    ED Course  Procedures (including critical care time)  Labs Reviewed  BASIC METABOLIC PANEL - Abnormal; Notable for the following:     Potassium 3.0 (*)     GFR calc non Af Amer 85 (*)     All other components within normal limits  CARDIAC PANEL(CRET KIN+CKTOT+MB+TROPI) - Abnormal; Notable for the following:    Total CK 269 (*)     All other components within normal limits  CARDIAC PANEL(CRET KIN+CKTOT+MB+TROPI) - Abnormal; Notable for the following:    Total CK 271 (*)     All other components within normal limits  CBC  DIFFERENTIAL   Dg Chest 2 View  02/07/2012  *RADIOLOGY REPORT*  Clinical Data: Dizziness.  CHEST - 2 VIEW  Comparison: 10/09/2011  Findings: Normal heart size and pulmonary vascularity.  Linear scarring in the mid and lower lungs bilaterally.  This is stable. No focal airspace consolidation.  No blunting of costophrenic angles.  No pneumothorax.  Degenerative changes in the spine. Mildly tortuous aorta.  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  No evidence of active pulmonary disease.  No significant interval change.  Original Report Authenticated By: Marlon Pel, M.D.     Date: 02/07/2012  Rate: 73  Rhythm: normal sinus rhythm  QRS Axis: normal  Intervals: normal  ST/T Wave abnormalities: normal  Conduction Disutrbances: none  Narrative Interpretation: unremarkable     1. Dizziness   2. Hypokalemia       MDM   Patient with with episodes of dizziness that she describes as vertigo where she is spinning and then states she feels like she may pass out. She has had 2 episodes in the last 24 hours. During his episode she has no chest pain or shortness of breath. She denies palpitations. No recent medication changes and otherwise feels normally. Her EKG is within normal limits. She has no heart history. Doubt this is cardiac in nature and she has never syncopized and she denies any palpitations or chest pain during these episodes. There is no history suggestive of stroke she has normal speech in no focal weakness. She's not had any ataxia and episodes only last for 1 minute or less. She has  not had any episodes here but there is no signs of ectopy on her EKG.   Chest x-ray is unchanged. CBC and cardiac markers at 0 and 3 hours are normal. BMP shows mild hypokalemia of 3.0 which is most likely coming from her hydrochlorothiazide. Patient was replaced and this was discussed with her. She will followup with her PCP for possible Holter monitor to ensure that she's not having dysrhythmia causing symptoms.       Gwyneth Sprout, MD 02/07/12 [id number]  Gwyneth Sprout, MD 02/07/12 [phone]

## 2012-02-07 NOTE — ED Notes (Signed)
Yesterday and today had dizziness and lightheadedness.

## 2012-02-07 NOTE — ED Notes (Signed)
Pt c/o feeling dizzy and flush for two day

## 2012-02-07 NOTE — Discharge Instructions (Signed)
Dizziness Dizziness is a common problem. It is a feeling of unsteadiness or lightheadedness. You may feel like you are about to faint. Dizziness can lead to injury if you stumble or fall. A person of any age group can suffer from dizziness, but dizziness is more common in older adults. CAUSES  Dizziness can be caused by many different things, including:  Middle ear problems.   Standing for too long.   Infections.   An allergic reaction.   Aging.   An emotional response to something, such as the sight of blood.   Side effects of medicines.   Fatigue.   Problems with circulation or blood pressure.   Excess use of alcohol, medicines, or illegal drug use.   Breathing too fast (hyperventilation).   An arrhythmia or problems with your heart rhythm.   Low red blood cell count (anemia).   Pregnancy.   Vomiting, diarrhea, fever, or other illnesses that cause dehydration.   Diseases or conditions such as Parkinson's disease, high blood pressure (hypertension), diabetes, and thyroid problems.   Exposure to extreme heat.  DIAGNOSIS  To find the cause of your dizziness, your caregiver may do a physical exam, lab tests, radiologic imaging scans, or an electrocardiography test (ECG).  TREATMENT  Treatment of dizziness depends on the cause of your symptoms and can vary greatly. HOME CARE INSTRUCTIONS   Drink enough fluids to keep your urine clear or pale yellow. This is especially important in very hot weather. In the elderly, it is also important in cold weather.   If your dizziness is caused by medicines, take them exactly as directed. When taking blood pressure medicines, it is especially important to get up slowly.   Rise slowly from chairs and steady yourself until you feel okay.   In the morning, first sit up on the side of the bed. When this seems okay, stand slowly while holding onto something until you know your balance is fine.   If you need to stand in one place for a  long time, be sure to move your legs often. Tighten and relax the muscles in your legs while standing.   If dizziness continues to be a problem, have someone stay with you for a day or two. Do this until you feel you are well enough to stay alone. Have the person call your caregiver if he or she notices changes in you that are concerning.   Do not drive or use heavy machinery if you feel dizzy.  SEEK IMMEDIATE MEDICAL CARE IF:   Your dizziness or lightheadedness gets worse.   You feel nauseous or vomit.   You develop problems with talking, walking, weakness, or using your arms, hands, or legs.   You are not thinking clearly or you have difficulty forming sentences. It may take a friend or family member to determine if your thinking is normal.   You develop chest pain, abdominal pain, shortness of breath, or sweating.   Your vision changes.   You notice any bleeding.   You have side effects from medicine that seems to be getting worse rather than better.  MAKE SURE YOU:   Understand these instructions.   Will watch your condition.   Will get help right away if you are not doing well or get worse.  Document Released: 01/25/2001 Document Revised: 07/21/2011 Document Reviewed: 02/18/2011 [facility] Patient Information 2012 [place], [place].Cardiac Event Monitoring A cardiac event monitor is a small recording device used to help detect abnormal heart rhythms. When the  heart does not beat in a stable or regular pattern, this is called an arrhythmia. An arrhythmia may or may not be felt. Palpitations, or fast heart beats, may be felt. A cardiac event monitor is used to record the heart rhythm when noticeable symptoms such as the following occur:  Palpitations, such as heart racing or fluttering.   Dizziness.   Fainting or lightheadedness.   Unexplained weakness.  The monitor is wired to two electrodes placed on your chest. Electrodes are flat sticky disks that attach to your skin. The  monitor stores the heart rhythm and can be worn for up to 30 days. You will wear the monitor at all times, except when bathing. When you feel symptoms of heart trouble, such as dizziness, weakness, lightheadedness, palpitations, "thumping," shortness of breath or a fluttering or racing heart, you will push a button on the monitor to record the heart rhythm.  Your caregiver will also ask you to keep a diary of your activities, such as walking, doing chores and taking medicine. Be sure to note what you are doing when you experience heart symptoms. This will help your caregiver determine what might be contributing to your symptoms. The information stored in your monitor will be reviewed by your caregiver alongside your diary entries.  LET YOUR CAREGIVER KNOW ABOUT: Allergies to any kind of medical tape. PROCEDURE  A technician will prepare your chest for the electrode placement. The technician will show you how to place the electrodes, how to work the monitor, how to replace the batteries and how to fill in your diary. Take time to practice using the monitor before you leave the office. Make sure you understand how to send the information from the monitor to your doctor. This requires a telephone with a land line, not a cellphone.   If you start to feel lightheaded, dizzy, weak or have fluttering or racing sensations in your heart, press the record button. The monitor is always on and records what happened slightly before you pressed the button, so do not worry about being too late to get good information.  HOME CARE INSTRUCTIONS   Wear your monitor at all times, except when you are in water:   Do not get the monitor wet.   Take the monitor off when bathing, do not swim or use a hot tub with it on.   Keep your skin clean, do not put body lotion or moisturizer on your chest.   It's possible that your skin under the electrodes could become irritated. To keep this from happening, try to put the  electrodes in slightly different places on your chest. However, they must remain in the area under your left breast and in the upper right section of your chest.   Change the electrodes daily or any time they stop sticking to your skin. You might need to use tape to keep them on.   Make sure the monitor is safely clipped to your clothing or in a location close to your body that your caregiver recommends.   Keep a diary of your activities, especially what you were doing when you pushed the button to record your heart symptoms.   Change the batteries as recommended by your caregiver.   Send the recorded information as recommended by your caregiver. It is important to understand that your caregiver does not have instantaneous results when a recording is made.  SEEK MEDICAL CARE IF:   Your doctor reviews your records and advises you to come to  the office or go to the ER.   You have questions or concerns about the cardiac event monitor.  SEEK IMMEDIATE MEDICAL CARE IF:   You have chest pain.   You have difficulty breathing or shortness of breath.   You develop a very fast heartbeat or your heart "skips" beats.   You develop dizziness which lasts several minutes.   You faint or feel you are about to faint.  MAKE SURE YOU:   Understand these instructions.   Will watch your condition.   Will get help right away if you are not doing well or get worse.  Document Released: 05/10/2008 Document Revised: 07/21/2011 Document Reviewed: 05/10/2008 [facility] Patient Information 2012 [place], [place].Foods Rich in Potassium Food / Potassium (mg)  Apricots, dried,  cup / 378 mg   Apricots, raw, 1 cup halves / 401 mg   Avocado,  / 487 mg   Banana, 1 large / 487 mg   Beef, lean, round, 3 oz / 202 mg   Cantaloupe, 1 cup cubes / 427 mg   Dates, medjool, 5 whole / 835 mg   Ham, cured, 3 oz / 212 mg   Lentils, dried,  cup / 458 mg   Lima beans, frozen,  cup / 258 mg   Orange, 1  large / 333 mg   Orange juice, 1 cup / 443 mg   Peaches, dried,  cup / 398 mg   Peas, split, cooked,  cup / 355 mg   Potato, boiled, 1 medium / 515 mg   Prunes, dried, uncooked,  cup / 318 mg   Raisins,  cup / 309 mg   Salmon, pink, raw, 3 oz / 275 mg   Sardines, canned , 3 oz / 338 mg   Tomato, raw, 1 medium / 292 mg   Tomato juice, 6 oz / 417 mg   [place], 3 oz / 349 mg  Document Released: 08/01/2005 Document Revised: 04/13/2011 Document Reviewed: 12/15/2008 [facility] Patient Information 2012 [place] [address], [place].

## 2012-07-16 ENCOUNTER — Telehealth: Payer: Self-pay | Admitting: Internal Medicine

## 2012-07-16 NOTE — Telephone Encounter (Signed)
Pt had blood in her stool. Pt scheduled to see Dr. Marina Goodell 07/27/12@9 :Beverlee Nims to notify pt of appt date and time. Spoke with medical records and requested labs and OV notes from Dr. Izola Price.

## 2012-07-27 ENCOUNTER — Ambulatory Visit: Payer: Medicare Other | Admitting: Internal Medicine

## 2012-08-06 ENCOUNTER — Ambulatory Visit (INDEPENDENT_AMBULATORY_CARE_PROVIDER_SITE_OTHER): Payer: Medicare Other | Admitting: Internal Medicine

## 2012-08-06 ENCOUNTER — Encounter: Payer: Self-pay | Admitting: Internal Medicine

## 2012-08-06 VITALS — BP 120/72 | HR 68 | Ht 62.0 in | Wt 182.2 lb

## 2012-08-06 DIAGNOSIS — K5730 Diverticulosis of large intestine without perforation or abscess without bleeding: Secondary | ICD-10-CM

## 2012-08-06 DIAGNOSIS — R195 Other fecal abnormalities: Secondary | ICD-10-CM

## 2012-08-06 MED ORDER — MOVIPREP 100 G PO SOLR: 1.0000 | Freq: Once | ORAL | Status: DC

## 2012-08-06 NOTE — Patient Instructions (Addendum)
You have been scheduled for a colonoscopy with propofol. Please follow written instructions given to you at your visit today.  Please pick up your prep kit at the pharmacy within the next 1-3 days. If you use inhalers (even only as needed) or a CPAP machine, please bring them with you on the day of your procedure.  CC: Dr Joycelyn Rua

## 2012-08-06 NOTE — Progress Notes (Signed)
HISTORY OF PRESENT ILLNESS:  [name] [name] is a 71 y.o. female with the below listed medical history who is sent today regarding Hemoccult-positive stool. The patient was seen previously in August of 2008 for routine screening colonoscopy. The examination was normal except for sigmoid diverticulosis. Routine followup in 10 years recommended. She has not been seen since. She recently underwent routine annual evaluation with her primary provider (reviewed). Hemoccult studies returned positive. Review of other blood work is unremarkable including hemoglobin of 14.3 and MCV of 92.5. Her GI review of systems is entirely negative. She denies melena or hematochezia. No interval family history of colon cancer.  REVIEW OF SYSTEMS:  All non-GI ROS negative except for  Past Medical History  Diagnosis Date  . Hypertension   . Hyperlipidemia   . Kidney stones   . Diverticulosis 2008  . Obesity   . Vitamin D deficiency     Past Surgical History  Procedure Date  . Kidney stone surgery   . Orthroscopic rt knee     Social History [name] [name]  reports that she has never smoked. She has never used smokeless tobacco. She reports that she drinks alcohol. She reports that she does not use illicit drugs.  family history includes Dementia in her mother; Heart disease in her maternal grandmother and mother; Lung cancer in her father; and Stroke in her maternal grandmother.  No Known Allergies     PHYSICAL EXAMINATION:  Vital signs: BP 120/72  Pulse 68  Ht 5\' 2"  (1.575 m)  Wt 182 lb 3.2 oz (82.645 kg)  BMI 33.32 kg/m2 General: Well-developed, well-nourished, no acute distress HEENT: Sclerae are anicteric, conjunctiva pink. Oral mucosa intact Lungs: Clear Heart: Regular Abdomen: soft, nontender, nondistended, no obvious ascites, no peritoneal signs, normal bowel sounds. No organomegaly. Extremities: No edema Psychiatric: alert and oriented x3. Cooperative    ASSESSMENT:  #1. Asymptomatic  Hemoccult-positive stool. Negative GI review of systems. Normal hemoglobin. #2. Colonoscopy 2008 with diverticulosis   PLAN:  #1. Colonoscopy to rule out significant mucosal pathology.The nature of the procedure, as well as the risks, benefits, and alternatives were carefully and thoroughly reviewed with the patient. Ample time for discussion and questions allowed. The patient understood, was satisfied, and agreed to proceed. Movi prep prescribed. The patient instructed on its use

## 2012-08-21 ENCOUNTER — Ambulatory Visit (AMBULATORY_SURGERY_CENTER): Payer: Medicare Other | Admitting: Internal Medicine

## 2012-08-21 ENCOUNTER — Encounter: Payer: Self-pay | Admitting: Internal Medicine

## 2012-08-21 VITALS — BP 106/71 | HR 62 | Temp 97.8°F | Resp 30 | Ht 62.0 in | Wt 182.2 lb

## 2012-08-21 DIAGNOSIS — K5730 Diverticulosis of large intestine without perforation or abscess without bleeding: Secondary | ICD-10-CM

## 2012-08-21 DIAGNOSIS — R195 Other fecal abnormalities: Secondary | ICD-10-CM

## 2012-08-21 MED ORDER — SODIUM CHLORIDE 0.9 % IV SOLN: 500.0000 mL | INTRAVENOUS | Status: DC

## 2012-08-21 NOTE — Progress Notes (Signed)
Patient did not experience any of the following events: a burn prior to discharge; a fall within the facility; wrong site/side/patient/procedure/implant event; or a hospital transfer or hospital admission upon discharge from the facility. (G8907) Patient did not have preoperative order for IV antibiotic SSI prophylaxis. (G8918)  

## 2012-08-21 NOTE — Progress Notes (Signed)
Propofol given over incremental dosages 

## 2012-08-21 NOTE — Op Note (Signed)
 [facility] [address] [place], [address]   COLONOSCOPY PROCEDURE REPORT  PATIENT: [name], [name]  MR#: [id number] BIRTHDATE: 1941-02-10 , 71  yrs. old GENDER: Female ENDOSCOPIST: Roxy Cedar, MD REFERRED [id number] Lenise Arena, M.D. PROCEDURE DATE:  08/21/2012 PROCEDURE:   Colonoscopy, diagnostic ASA CLASS:   Class II INDICATIONS:heme-positive stool (asymptomatic; normal Hg); screening colonoscopy 2008 (tics only). MEDICATIONS: MAC sedation, administered by CRNA and propofol (Diprivan) 80mg  IV  DESCRIPTION OF PROCEDURE:   After the risks benefits and alternatives of the procedure were thoroughly explained, informed consent was obtained.  A digital rectal exam revealed no abnormalities of the rectum.   The LB [id number]  endoscope was introduced through the anus and advanced to the cecum, which was identified by both the appendix and ileocecal valve. No adverse events experienced.   The quality of the prep was excellent, using MoviPrep  The instrument was then slowly withdrawn as the colon was fully examined.      COLON FINDINGS: Moderate diverticulosis was noted in the sigmoid colon.   The colon was otherwise normal.  There was no inflammation, polyps or cancers .  Retroflexed views revealed internal hemorrhoids. The time to cecum=2 minutes 48 seconds. Withdrawal time=9 minutes 15 seconds.  The scope was withdrawn and the procedure completed. COMPLICATIONS: There were no complications.  ENDOSCOPIC IMPRESSION: 1.   Moderate diverticulosis was noted in the sigmoid colon 2.   The colon was otherwise normal  RECOMMENDATIONS: 1. Continue current colorectal screening recommendations for "routine risk" patients with a repeat colonoscopy in 10 years.   eSigned:  Roxy Cedar, MD 08/21/2012 12:42 PM   cc: Joycelyn Rua, MD and The Patient

## 2012-08-21 NOTE — Patient Instructions (Addendum)

## 2012-08-22 ENCOUNTER — Telehealth: Payer: Self-pay | Admitting: *Deleted

## 2012-08-22 NOTE — Telephone Encounter (Signed)
No answer. Number identifier. Message left to call if questions or concerns. 

## 2012-11-05 ENCOUNTER — Emergency Department (HOSPITAL_BASED_OUTPATIENT_CLINIC_OR_DEPARTMENT_OTHER): Payer: Medicare Other

## 2012-11-05 ENCOUNTER — Emergency Department (HOSPITAL_BASED_OUTPATIENT_CLINIC_OR_DEPARTMENT_OTHER)
Admission: EM | Admit: 2012-11-05 | Discharge: 2012-11-05 | Disposition: A | Discharge status: Home / Self Care | Payer: Medicare Other | Attending: Emergency Medicine | Admitting: Emergency Medicine

## 2012-11-05 ENCOUNTER — Encounter (HOSPITAL_BASED_OUTPATIENT_CLINIC_OR_DEPARTMENT_OTHER): Payer: Self-pay

## 2012-11-05 DIAGNOSIS — M545 Low back pain, unspecified: Secondary | ICD-10-CM | POA: Insufficient documentation

## 2012-11-05 DIAGNOSIS — Z87442 Personal history of urinary calculi: Secondary | ICD-10-CM | POA: Insufficient documentation

## 2012-11-05 DIAGNOSIS — R52 Pain, unspecified: Secondary | ICD-10-CM | POA: Insufficient documentation

## 2012-11-05 DIAGNOSIS — K219 Gastro-esophageal reflux disease without esophagitis: Secondary | ICD-10-CM | POA: Insufficient documentation

## 2012-11-05 DIAGNOSIS — E559 Vitamin D deficiency, unspecified: Secondary | ICD-10-CM | POA: Insufficient documentation

## 2012-11-05 DIAGNOSIS — Z8719 Personal history of other diseases of the digestive system: Secondary | ICD-10-CM | POA: Insufficient documentation

## 2012-11-05 DIAGNOSIS — E785 Hyperlipidemia, unspecified: Secondary | ICD-10-CM | POA: Insufficient documentation

## 2012-11-05 DIAGNOSIS — I10 Essential (primary) hypertension: Secondary | ICD-10-CM | POA: Insufficient documentation

## 2012-11-05 DIAGNOSIS — Z79899 Other long term (current) drug therapy: Secondary | ICD-10-CM | POA: Insufficient documentation

## 2012-11-05 DIAGNOSIS — N390 Urinary tract infection, site not specified: Secondary | ICD-10-CM | POA: Insufficient documentation

## 2012-11-05 DIAGNOSIS — Z7982 Long term (current) use of aspirin: Secondary | ICD-10-CM | POA: Insufficient documentation

## 2012-11-05 DIAGNOSIS — E669 Obesity, unspecified: Secondary | ICD-10-CM | POA: Insufficient documentation

## 2012-11-05 HISTORY — DX: Gastro-esophageal reflux disease without esophagitis: K21.9

## 2012-11-05 LAB — URINALYSIS, ROUTINE W REFLEX MICROSCOPIC
Ketones, ur: 15 mg/dL — AB
Nitrite: NEGATIVE
Protein, ur: NEGATIVE mg/dL
Urobilinogen, UA: 1 mg/dL (ref 0.0–1.0)

## 2012-11-05 LAB — URINE MICROSCOPIC-ADD ON

## 2012-11-05 MED ORDER — OXYCODONE-ACETAMINOPHEN 5-325 MG PO TABS: 1.0000 | ORAL_TABLET | ORAL | Status: DC | PRN

## 2012-11-05 MED ORDER — CEPHALEXIN 500 MG PO CAPS: 500.0000 mg | ORAL_CAPSULE | Freq: Three times a day (TID) | ORAL | Status: DC

## 2012-11-05 MED ORDER — ORPHENADRINE CITRATE ER 100 MG PO TB12: 100.0000 mg | ORAL_TABLET | Freq: Two times a day (BID) | ORAL | Status: DC

## 2012-11-05 MED ORDER — OXYCODONE-ACETAMINOPHEN 5-325 MG PO TABS
1.0000 | ORAL_TABLET | Freq: Once | ORAL | Status: AC
  Administered 2012-11-05: 1 via ORAL
  Filled 2012-11-05 (×2): qty 1

## 2012-11-05 MED ORDER — NAPROXEN 375 MG PO TABS: 375.0000 mg | ORAL_TABLET | Freq: Two times a day (BID) | ORAL | Status: DC

## 2012-11-05 NOTE — ED Notes (Signed)
Pt states that she woke up this morning with severe lower back pain on both L and R sides.  Pt states that pain is isolated to this location (does not radiate down to hip/legs).  Pt states that she has hx of kidney stones and this pain feels very similar.

## 2012-11-05 NOTE — ED Provider Notes (Signed)
History     CSN: [id number]  Arrival date & time 11/05/12  9147   First MD Initiated Contact with Patient 11/05/12 [phone]      Chief Complaint  Patient presents with  . Back Pain    (Consider location/radiation/quality/duration/timing/severity/associated sxs/prior treatment) Patient is a 72 y.o. female presenting with back pain. The history is provided by the patient.  Back Pain She had onset this morning of severe lower back pain. Pain is mainly on the left but does radiate across to the right. There is no radiation of the hips or legs. She describes the pain as sharp it is worse with walking and worse with trying to lift her left leg. It is better if she holds still. There's no associated nausea or vomiting. She denies any urinary symptoms. She rates pain at 10/10 when she tries to walk. She has not taken any medication for it. She has history of kidney stones and states that this pain is very similar to that.  Past Medical History  Diagnosis Date  . Hypertension   . Hyperlipidemia   . Kidney stones   . Diverticulosis 2008  . Obesity   . Vitamin D deficiency   . Kidney stones   . GERD (gastroesophageal reflux disease)     Past Surgical History  Procedure Laterality Date  . Kidney stone surgery    . Orthroscopic rt knee      Family History  Problem Relation Age of Onset  . Heart disease Mother   . Dementia Mother   . Lung cancer Father   . Stroke Maternal Grandmother   . Heart disease Maternal Grandmother   . Colon cancer Neg Hx   . Esophageal cancer Neg Hx   . Rectal cancer Neg Hx   . Stomach cancer Neg Hx     History  Substance Use Topics  . Smoking status: Never Smoker   . Smokeless tobacco: Never Used  . Alcohol Use: Yes     Comment: wine occ    OB History   Grav Para Term Preterm Abortions TAB SAB Ect Mult Living                  Review of Systems  Musculoskeletal: Positive for back pain.  All other systems reviewed and are  negative.    Allergies  Review of patient's allergies indicates no known allergies.  Home Medications   Current Outpatient Rx  Name  Route  Sig  Dispense  Refill  . amLODipine (NORVASC) 5 MG tablet   Oral   Take 5 mg by mouth daily.         [name] aspirin EC 81 MG tablet   Oral   Take 81 mg by mouth daily.         . Cholecalciferol (VITAMIN D3) 2000 UNITS capsule   Oral   Take 2,000 Units by mouth daily.         . fish oil-omega-3 fatty acids 1000 MG capsule   Oral   Take 2 g by mouth daily.         . hydrochlorothiazide (HYDRODIURIL) 25 MG tablet   Oral   Take 12.5 mg by mouth daily.          [name] omeprazole (PRILOSEC) 20 MG capsule   Oral   Take 20 mg by mouth daily.         . rosuvastatin (CRESTOR) 10 MG tablet   Oral   Take 10 mg by mouth daily.         [name]  nitroGLYCERIN (NITROSTAT) 0.4 MG SL tablet   Sublingual   Place 0.4 mg under the tongue every 5 (five) minutes as needed.           BP 126/74  Pulse 76  Temp(Src) 99 F (37.2 C) (Oral)  Resp 16  Ht 5\' 5"  (1.651 m)  Wt 177 lb (80.287 kg)  BMI 29.45 kg/m2  SpO2 97%  Physical Exam  Nursing note and vitals reviewed.  72 year old female, resting comfortably and in no acute distress. Vital signs are normal. Oxygen saturation is 97%, which is normal. Head is normocephalic and atraumatic. PERRLA, EOMI. Oropharynx is clear. Neck is nontender and supple without adenopathy or JVD. Back is nontender and there is no CVA tenderness. There is no palpable muscle spasm. Straight leg raise is positive on the left at 60 and negative on the right. Lungs are clear without rales, wheezes, or rhonchi. Chest is nontender. Heart has regular rate and rhythm without murmur. Abdomen is soft, flat, nontender without masses or hepatosplenomegaly and peristalsis is normoactive. Extremities have no cyanosis or edema, full range of motion is present. Skin is warm and dry without rash. Neurologic: Mental status is  normal, cranial nerves are intact, there are no motor or sensory deficits.  ED Course  Procedures (including critical care time)  Results for orders placed during the hospital encounter of 11/05/12  URINALYSIS, ROUTINE W REFLEX MICROSCOPIC      Result Value Range   Color, Urine AMBER (*) YELLOW   APPearance CLEAR  CLEAR   Specific Gravity, Urine 1.024  1.005 - 1.030   pH 6.5  5.0 - 8.0   Glucose, UA NEGATIVE  NEGATIVE mg/dL   Hgb urine dipstick NEGATIVE  NEGATIVE   Bilirubin Urine SMALL (*) NEGATIVE   Ketones, ur 15 (*) NEGATIVE mg/dL   Protein, ur NEGATIVE  NEGATIVE mg/dL   Urobilinogen, UA 1.0  0.0 - 1.0 mg/dL   Nitrite NEGATIVE  NEGATIVE   Leukocytes, UA SMALL (*) NEGATIVE  URINE MICROSCOPIC-ADD ON      Result Value Range   Squamous Epithelial / LPF FEW (*) RARE   WBC, UA 3-6  <3 WBC/hpf   RBC / HPF 0-2  <3 RBC/hpf   Bacteria, UA MANY (*) RARE   Ct Abdomen Pelvis Wo Contrast  11/05/2012  *RADIOLOGY REPORT*  Clinical Data: Back pain, left greater than right.  History of kidney stones.  CT ABDOMEN AND PELVIS WITHOUT CONTRAST  Technique:  Multidetector CT imaging of the abdomen and pelvis was performed following the standard protocol without intravenous contrast.  Comparison: None.  Findings: Liver measures 18.9 cm in cranial caudal length.  No focal abnormalities seen in the liver or spleen on this study performed without intravenous contrast material.  The stomach, duodenum, gallbladder, and adrenal glands are unremarkable. Punctate calcification seen along the pancreatic tail is probably related to the splenic artery.  No stones are seen in either kidney.  There is no hydronephrosis. 8 mm peripheral low density focus in the lateral right kidney is too small to characterize, but likely represents a tiny cortical cyst. The no evidence for ureteral or bladder stones.  No abdominal aortic aneurysm.  There is no free fluid or lymphadenopathy in the abdomen.  The abdominal bowel loops are  unremarkable.  Imaging through the pelvis shows no free intraperitoneal fluid. There is no pelvic sidewall lymphadenopathy.  Uterus is unremarkable.  No adnexal mass.  Diverticular change noted in the sigmoid colon without diverticulitis.  Terminal  ileum is normal.  The appendix is normal.  Bone windows reveal no worrisome lytic or sclerotic osseous lesions.  Degenerative disc and endplate disease is seen at L2-3.  IMPRESSION: The no findings to explain the patient's history of left flank pain.  Hepatomegaly.   Original Report Authenticated By: Kennith Center, M.D.       1. Acute low back pain   2. Urinary tract infection       MDM  Low back pain which seems predominantly muscular. Given that she states pain is similar to kidney stone pain, CT of abdomen and pelvis will be obtained. She's given a dose of Percocet in the ED. Old records are reviewed and she has no imaging studies of her abdomen on file.  She got good relief of pain with Percocet. CT shows no evidence of aortic aneurysm or urolithiasis. Incidental finding is noted of UTI. It is possible that is contributing to her pain but I think it is likely related. She is discharged with prescriptions for Keflex for UTI. Back pain will be treated as musculoskeletal and she's given prescriptions for naproxen, orphenadrine, and Percocet. She is to followup with her PCP in 3 days at which point culture results can be checked.      Dione Booze, MD 11/05/12 1047

## 2012-11-06 LAB — URINE CULTURE

## 2012-11-29 ENCOUNTER — Other Ambulatory Visit: Payer: Self-pay | Admitting: Family Medicine

## 2012-11-29 ENCOUNTER — Other Ambulatory Visit: Payer: Self-pay

## 2012-11-29 DIAGNOSIS — Z1231 Encounter for screening mammogram for malignant neoplasm of breast: Secondary | ICD-10-CM

## 2012-12-27 ENCOUNTER — Ambulatory Visit
Admission: RE | Admit: 2012-12-27 | Discharge: 2012-12-27 | Disposition: A | Discharge status: Home / Self Care | Payer: Medicare Other | Source: Ambulatory Visit

## 2012-12-27 DIAGNOSIS — Z1231 Encounter for screening mammogram for malignant neoplasm of breast: Secondary | ICD-10-CM

## 2013-05-05 ENCOUNTER — Encounter: Payer: Self-pay | Admitting: *Deleted

## 2013-05-09 ENCOUNTER — Encounter: Payer: Self-pay | Admitting: Cardiovascular Disease

## 2013-05-09 ENCOUNTER — Ambulatory Visit (INDEPENDENT_AMBULATORY_CARE_PROVIDER_SITE_OTHER): Payer: Medicare Other | Admitting: Cardiovascular Disease

## 2013-05-09 VITALS — BP 138/72 | HR 72 | Ht 65.0 in | Wt 181.6 lb

## 2013-05-09 DIAGNOSIS — I358 Other nonrheumatic aortic valve disorders: Secondary | ICD-10-CM

## 2013-05-09 DIAGNOSIS — I10 Essential (primary) hypertension: Secondary | ICD-10-CM

## 2013-05-09 DIAGNOSIS — E785 Hyperlipidemia, unspecified: Secondary | ICD-10-CM

## 2013-05-09 DIAGNOSIS — I359 Nonrheumatic aortic valve disorder, unspecified: Secondary | ICD-10-CM

## 2013-05-09 DIAGNOSIS — I119 Hypertensive heart disease without heart failure: Secondary | ICD-10-CM

## 2013-05-09 NOTE — Patient Instructions (Addendum)
Your physician has requested that you have an echocardiogram. Echocardiography is a painless test that uses sound waves to create images of your heart. It provides your doctor with information about the size and shape of your heart and how well your heart's chambers and valves are working. This procedure takes approximately one hour. There are no restrictions for this procedure.  this will be done in march 2015. With a follow up appointment as well.  Your physician recommends that you return for lab work fasting. You will not need a appointment to have this done.

## 2013-05-11 ENCOUNTER — Encounter: Payer: Self-pay | Admitting: Cardiovascular Disease

## 2013-05-11 DIAGNOSIS — I10 Essential (primary) hypertension: Secondary | ICD-10-CM | POA: Insufficient documentation

## 2013-05-11 DIAGNOSIS — I358 Other nonrheumatic aortic valve disorders: Secondary | ICD-10-CM | POA: Insufficient documentation

## 2013-05-11 DIAGNOSIS — E785 Hyperlipidemia, unspecified: Secondary | ICD-10-CM | POA: Insufficient documentation

## 2013-05-11 NOTE — Progress Notes (Signed)
Patient ID: [name] [name], female   DOB: 06-05-1941, 72 y.o.   MRN: [id number]     HPI: [name] [name], is a 72 y.o. female who presents to the office for a one-year cardiology evaluation.  [name] has a history of hypertension, hyperlipidemia, and mild obesity. An echo Doppler study in 2012 which was done after she experienced exertional shortness of while visiting her daughter in [place] normal systolic function with mild mitral annular calcification mild MR, mild TR, and mild aortic valve sclerosis. She has a history of hyperkalemia in the past. When I saw her one year ago I recommended that she can try to discontinue her hydrochlorothiazide depending upon swelling.  Over the past year, she states she has done well. She denies recent chest pain or swelling. At times she does note leg discomfort at night. 3 weeks ago she stopped taking her Crestor and has noticed some slight improvement in this. She presents for evaluation.  Past Medical History  Diagnosis Date  . Hypertension   . Hyperlipidemia   . Kidney stones   . Diverticulosis 2008  . Obesity   . Vitamin D deficiency   . Kidney stones   . GERD (gastroesophageal reflux disease)   . Hx of echocardiogram 10/2010    which revealed normal systolic as well as diastolic function. she did have mild mitral annular calcification with mild MR and TR. She had mild aortic sclerosis without stenosis.  [name] History of stress test 10/2010    which she execised a 7 met workload achieving a peak heart rate of 141, representing 93% of predicted maximum. Scintigraphic images revealed ormal perfusion.    Past Surgical History  Procedure Laterality Date  . Kidney stone surgery    . Orthroscopic rt knee      No Known Allergies  Current Outpatient Prescriptions  Medication Sig Dispense Refill  . amLODipine (NORVASC) 5 MG tablet Take 5 mg by mouth daily.      [name] aspirin EC 81 MG tablet Take 81 mg by mouth daily.      . Cholecalciferol (VITAMIN D3)  2000 UNITS capsule Take 2,000 Units by mouth daily.      . fish oil-omega-3 fatty acids 1000 MG capsule Take 2 g by mouth daily.      . hydrochlorothiazide (HYDRODIURIL) 25 MG tablet Take 12.5 mg by mouth daily.       . naproxen (NAPROSYN) 375 MG tablet Take 1 tablet (375 mg total) by mouth 2 (two) times daily.  20 tablet  0  . nitroGLYCERIN (NITROSTAT) 0.4 MG SL tablet Place 0.4 mg under the tongue every 5 (five) minutes as needed.      [name] omeprazole (PRILOSEC) 20 MG capsule Take 20 mg by mouth daily.      [name] ZOSTAVAX 08657 UNT/0.65ML injection        No current facility-administered medications for this visit.    History   Social History  . Marital Status: Widowed    Spouse Name: N/A    Number of Children: 2  . Years of Education: N/A   Occupational History  . Retired    Social History Main Topics  . Smoking status: Never Smoker   . Smokeless tobacco: Never Used  . Alcohol Use: Yes     Comment: wine occ  . Drug Use: No  . Sexual Activity: No   Other Topics Concern  . Not on file   Social History Narrative  . No narrative on file   Socially she  is widowed and has 2 children 4 grandchildren 2 great-grandchildren. There is no tobacco or alcohol history per  Family History  Problem Relation Age of Onset  . Heart disease Mother   . Dementia Mother   . Lung cancer Father   . Stroke Maternal Grandmother   . Heart disease Maternal Grandmother   . Colon cancer Neg Hx   . Esophageal cancer Neg Hx   . Rectal cancer Neg Hx   . Stomach cancer Neg Hx     ROS is negative for fevers, chills or night sweats. She denies recent chest pain or shortness of breath. She denies palpitations. She denies wheezing. She denies abdominal pain. She had noticed some vague swelling per she did notice some leg achy at night and stop Crestor 3 weeks ago. She denies claudication.  Other system review is negative.  PE BP 138/72  Pulse 72  Ht 5\' 5"  (1.651 m)  Wt 181 lb 9.6 oz (82.373 kg)  BMI  30.22 kg/m2  General: Alert, oriented, no distress.  Skin: normal turgor, no rashes HEENT: Normocephalic, atraumatic. Pupils round and reactive; sclera anicteric;no lid lag.  Nose without nasal septal hypertrophy Mouth/Parynx benign; Mallinpatti scale 2 Neck: No JVD, no carotid briuts Lungs: clear to ausculatation and percussion; no wheezing or rales Heart: RRR, s1 s2 normal 1/6 systolic murmur Abdomen: soft, nontender; no hepatosplenomehaly, BS+; abdominal aorta nontender and not dilated by palpation. Pulses 2+ Extremities: no clubbing cyanosis or edema, Homan's sign negative  Neurologic: grossly nonfocal  ECG: Sinus rhythm at 72 beats per minute;  nonspecific T changes  LABS:  BMET    Component Value Date/Time   NA 141 02/07/2012 2016   K 3.0* 02/07/2012 2016   CL 99 02/07/2012 2016   CO2 30 02/07/2012 2016   GLUCOSE 93 02/07/2012 2016   BUN 14 02/07/2012 2016   CREATININE 0.70 02/07/2012 2016   CALCIUM 9.7 02/07/2012 2016   GFRNONAA 85* 02/07/2012 2016   GFRAA >90 02/07/2012 2016     Hepatic Function Panel  No results found for this basename: prot, albumin, ast, alt, alkphos, bilitot, bilidir, ibili     CBC    Component Value Date/Time   WBC 8.6 02/07/2012 2016   RBC 4.61 02/07/2012 2016   HGB 14.6 02/07/2012 2016   HCT 41.2 02/07/2012 2016   PLT 151 02/07/2012 2016   MCV 89.4 02/07/2012 2016   MCH 31.7 02/07/2012 2016   MCHC 35.4 02/07/2012 2016   RDW 12.8 02/07/2012 2016   LYMPHSABS 2.3 02/07/2012 2016   MONOABS 0.6 02/07/2012 2016   EOSABS 0.1 02/07/2012 2016   BASOSABS 0.0 02/07/2012 2016     BNP No results found for this basename: probnp    Lipid Panel  No results found for this basename: chol, trig, hdl, cholhdl, vldl, ldlcalc     RADIOLOGY: No results found.    ASSESSMENT AND PLAN:  Past year, Ms. Bartel has done well. Several years ago she did notice exertional shortness of breath while walking [place] at [place]. A nuclear perfusion study in March  2012 was normal. She's now been off Crestor for several weeks. I am recommending she undergo an NMR lipoprotein a and laboratory remotely, she was noted to have increased small LDL small particles. I will contact her regarding the results and if her cholesterol has significantly risen we may need to try an additional agent. In March 2015 she will undergo her three-year followup echo Doppler study and I will see her  back in the office for followup evaluation.    Lennette Bihari, MD, [facility]  05/11/2013 9:22 AM

## 2013-05-21 LAB — NMR LIPOPROFILE WITH LIPIDS
Cholesterol, Total: 181 mg/dL (ref <200)
HDL Size: 8.9 nm — ABNORMAL LOW (ref >=9.2)
HDL-C: 49 mg/dL (ref >=40)
Large HDL-P: 5 umol/L (ref >=4.8)
Triglycerides: 86 mg/dL (ref <150)
VLDL Size: 42.4 nm (ref <=46.6)

## 2013-05-30 ENCOUNTER — Other Ambulatory Visit: Payer: Self-pay | Admitting: *Deleted

## 2013-05-30 MED ORDER — SIMVASTATIN 20 MG PO TABS: 20.0000 mg | ORAL_TABLET | Freq: Every day | ORAL | Status: DC

## 2013-05-30 NOTE — Progress Notes (Signed)
Quick Note:  Informed patient of lab findings and Dr. Landry Dyke recommendations. She agreed to start the simvastatin.prescription will be sent to pharmacy on file in chart. ______

## 2013-06-06 ENCOUNTER — Encounter: Payer: Self-pay | Admitting: Cardiovascular Disease

## 2013-10-24 ENCOUNTER — Other Ambulatory Visit: Payer: Self-pay | Admitting: *Deleted

## 2013-10-24 MED ORDER — SIMVASTATIN 20 MG PO TABS: 20.0000 mg | ORAL_TABLET | Freq: Every day | ORAL | Status: DC

## 2013-11-07 ENCOUNTER — Ambulatory Visit (HOSPITAL_COMMUNITY)
Admission: RE | Admit: 2013-11-07 | Discharge: 2013-11-07 | Disposition: A | Discharge status: Home / Self Care | Payer: Medicare Other | Source: Ambulatory Visit | Attending: Cardiology | Admitting: Cardiology

## 2013-11-07 DIAGNOSIS — I119 Hypertensive heart disease without heart failure: Secondary | ICD-10-CM | POA: Insufficient documentation

## 2013-11-07 DIAGNOSIS — I359 Nonrheumatic aortic valve disorder, unspecified: Secondary | ICD-10-CM

## 2013-11-07 NOTE — Progress Notes (Signed)
2D Echo Performed 11/07/2013    Newell Frater, RCS  

## 2013-11-20 ENCOUNTER — Other Ambulatory Visit: Payer: Self-pay

## 2013-11-20 DIAGNOSIS — Z1231 Encounter for screening mammogram for malignant neoplasm of breast: Secondary | ICD-10-CM

## 2013-11-21 ENCOUNTER — Encounter: Payer: Self-pay | Admitting: Cardiology

## 2013-11-21 ENCOUNTER — Ambulatory Visit (INDEPENDENT_AMBULATORY_CARE_PROVIDER_SITE_OTHER): Payer: Medicare Other | Admitting: Cardiology

## 2013-11-21 ENCOUNTER — Ambulatory Visit: Payer: Medicare Other | Admitting: Cardiovascular Disease

## 2013-11-21 VITALS — BP 130/80 | HR 77 | Ht 65.0 in | Wt 179.0 lb

## 2013-11-21 DIAGNOSIS — I10 Essential (primary) hypertension: Secondary | ICD-10-CM

## 2013-11-21 DIAGNOSIS — I359 Nonrheumatic aortic valve disorder, unspecified: Secondary | ICD-10-CM

## 2013-11-21 DIAGNOSIS — I358 Other nonrheumatic aortic valve disorders: Secondary | ICD-10-CM

## 2013-11-21 DIAGNOSIS — E785 Hyperlipidemia, unspecified: Secondary | ICD-10-CM

## 2013-11-21 NOTE — Patient Instructions (Signed)
Follow up with Dr. Tresa EndoKelly in 6 months.  Have cholesterol and hepatic panel done in next few weeks, do not eat or drink after midnight the night before the test.   Call if any problems in the mean time.

## 2013-11-21 NOTE — Progress Notes (Signed)
11/22/2013   PCP: Orpah Melter, MD   Chief Complaint  Patient presents with  . Follow-up    Primary Cardiologist:  Dr. Claiborne Billings  HPI: 73 year old female presents to the office for routine followup. She has a history of hypertension, hyperlipidemia and mild obesity. An echo study in 2012 after shortness of breath revealed normal systolic function with mild mitral annular calcification, mild MR, mild TR and mild aortic valve sclerosis. She has a history of hyperkalemia in the past she had decreased her hydrochlorothiazide on recommendation from Dr. Claiborne Billings she does not wish to stop it.    On her last visit in September with Dr. Claiborne Billings he recommended an echocardiogram which she has had done he also asked to have an NMR. lipoprotein A.   With those results and problems with Crestor but Dr. Claiborne Billings placed her on simvastatin and she is doing quite well with the simvastatin.  She had an increase in her LDL-P.  LDL particle #1587 LDL calculated 1:15.  Her 2-D echo was done 11/20/2013 her EF was normal at 60-65%, she had mild aortic regurg and mild mitral regurg.  Not much change in the echo from previous echo. Patient was informed of these results along with her cholesterol results.  She has no complaints no chest pain or shortness of breath occasionally she'll have indigestion and takes Prilosec which resolved but indigestion. She did have an episode 2 weeks ago she had Tea at [facility] and after that everything she tasted was very bitter.  She talked to the [employer] people and she had the Tea at the bottom of the pot which may have had some sediment that caused a reaction.   she did not have any trouble swallowing or breathing.      No Known Allergies  Current Outpatient Prescriptions  Medication Sig Dispense Refill  . amLODipine (NORVASC) 5 MG tablet Take 5 mg by mouth daily.      [name] aspirin EC 81 MG tablet Take 81 mg by mouth daily.      . Cholecalciferol (VITAMIN D3) 2000  UNITS capsule Take 2,000 Units by mouth daily.      . fish oil-omega-3 fatty acids 1000 MG capsule Take 2 g by mouth daily.      . hydrochlorothiazide (HYDRODIURIL) 25 MG tablet Take 12.5 mg by mouth daily.       . naproxen (NAPROSYN) 375 MG tablet Take 1 tablet (375 mg total) by mouth 2 (two) times daily.  20 tablet  0  . nitroGLYCERIN (NITROSTAT) 0.4 MG SL tablet Place 0.4 mg under the tongue every 5 (five) minutes as needed.      [name] omeprazole (PRILOSEC) 20 MG capsule Take 20 mg by mouth daily.      . simvastatin (ZOCOR) 20 MG tablet Take 1 tablet (20 mg total) by mouth at bedtime.  30 tablet  6  . ZOSTAVAX [id number] UNT/0.65ML injection        No current facility-administered medications for this visit.    Past Medical History  Diagnosis Date  . Hypertension   . Hyperlipidemia   . Kidney stones   . Diverticulosis 2008  . Obesity   . Vitamin D deficiency   . Kidney stones   . GERD (gastroesophageal reflux disease)   . Hx of echocardiogram 10/2010    which revealed normal systolic as well as diastolic function. she did have mild mitral annular calcification with mild MR and TR. She had mild  aortic sclerosis without stenosis.  [name] History of stress test 10/2010    which she execised a 7 met workload achieving a peak heart rate of 141, representing 93% of predicted maximum. Scintigraphic images revealed ormal perfusion.    Past Surgical History  Procedure Laterality Date  . Kidney stone surgery    . Orthroscopic rt knee      UIQ:NVVYXAJ:LU colds or fevers, no weight changes Skin:no rashes or ulcers HEENT:no blurred vision, no congestion CV:see HPI PUL:see HPI GI:no diarrhea constipation or melena, no indigestion GU:no hematuria, no dysuria MS:no joint pain, no claudication Neuro:no syncope, no lightheadedness Endo:no diabetes, no thyroid disease  PHYSICAL EXAM BP 130/80  Pulse 77  Ht 5' 5"  (1.651 m)  Wt 179 lb (81.194 kg)  BMI 29.79 kg/m2 General:Pleasant affect,  NAD Skin:Warm and dry, brisk capillary refill HEENT:normocephalic, sclera clear, mucus membranes moist Neck:supple, no JVD, no bruits  Heart:S1S2 RRR with 1/6 systolic murmur, no gallup, rub or click Lungs:clear without rales, rhonchi, or wheezes NGB:MBOM, non tender, + BS, do not palpate liver spleen or masses Ext:no lower ext edema, 2+ pedal pulses, 2+ radial pulses Neuro:alert and oriented, MAE, follows commands, + facial symmetry  EKG:SR normal EKG, no changes from 04/29/13  ASSESSMENT AND PLAN Aortic valve sclerosis Stable by echo, no shortness of breath no chest pain.  Followup with Dr. Claiborne Billings in 6 months.  Hyperlipidemia Is now on simvastatin without complications. We'll check a lipid panel and hepatic panel in the next several weeks she'll be fasting prior to the test.

## 2013-11-22 NOTE — Assessment & Plan Note (Signed)
Stable by echo, no shortness of breath no chest pain.  Followup with Dr. Tresa EndoKelly in 6 months.

## 2013-11-22 NOTE — Assessment & Plan Note (Signed)
Is now on simvastatin without complications. We'll check a lipid panel and hepatic panel in the next several weeks she'll be fasting prior to the test.

## 2013-11-29 LAB — LIPID PANEL
CHOL/HDL RATIO: 2.8 ratio
Cholesterol: 145 mg/dL (ref 0–200)
HDL: 52 mg/dL (ref >39)
LDL CALC: 78 mg/dL (ref 0–99)
Triglycerides: 77 mg/dL (ref <150)
VLDL: 15 mg/dL (ref 0–40)

## 2013-11-29 LAB — HEPATIC FUNCTION PANEL
ALT: 15 U/L (ref 0–35)
AST: 19 U/L (ref 0–37)
Albumin: 4.2 g/dL (ref 3.5–5.2)
Alkaline Phosphatase: 78 U/L (ref 39–117)
BILIRUBIN DIRECT: 0.2 mg/dL (ref 0.0–0.3)
BILIRUBIN TOTAL: 0.8 mg/dL (ref 0.2–1.2)
Indirect Bilirubin: 0.6 mg/dL (ref 0.2–1.2)
Total Protein: 7 g/dL (ref 6.0–8.3)

## 2013-12-03 NOTE — Progress Notes (Signed)
Pt. Called, no answer left message

## 2013-12-30 ENCOUNTER — Ambulatory Visit: Payer: Medicare Other

## 2014-01-02 ENCOUNTER — Encounter (INDEPENDENT_AMBULATORY_CARE_PROVIDER_SITE_OTHER): Payer: Self-pay

## 2014-01-02 ENCOUNTER — Ambulatory Visit
Admission: RE | Admit: 2014-01-02 | Discharge: 2014-01-02 | Disposition: A | Discharge status: Home / Self Care | Payer: Medicare Other | Source: Ambulatory Visit

## 2014-01-02 DIAGNOSIS — Z1231 Encounter for screening mammogram for malignant neoplasm of breast: Secondary | ICD-10-CM

## 2014-04-14 ENCOUNTER — Telehealth: Payer: Self-pay | Admitting: *Deleted

## 2014-04-14 NOTE — Telephone Encounter (Signed)
Faxed okay to hold ASA to patient's dentist. [facility].

## 2014-05-19 ENCOUNTER — Telehealth: Payer: Self-pay | Admitting: Cardiovascular Disease

## 2014-05-19 NOTE — Telephone Encounter (Signed)
Closed encounter °

## 2014-05-21 ENCOUNTER — Ambulatory Visit: Payer: Medicare Other | Admitting: Cardiovascular Disease

## 2014-07-08 ENCOUNTER — Ambulatory Visit (INDEPENDENT_AMBULATORY_CARE_PROVIDER_SITE_OTHER): Payer: Medicare Other | Admitting: Cardiovascular Disease

## 2014-07-08 ENCOUNTER — Encounter: Payer: Self-pay | Admitting: Cardiovascular Disease

## 2014-07-08 VITALS — BP 144/87 | HR 78 | Ht 60.0 in | Wt 184.0 lb

## 2014-07-08 DIAGNOSIS — I351 Nonrheumatic aortic (valve) insufficiency: Secondary | ICD-10-CM | POA: Insufficient documentation

## 2014-07-08 DIAGNOSIS — E785 Hyperlipidemia, unspecified: Secondary | ICD-10-CM

## 2014-07-08 DIAGNOSIS — K219 Gastro-esophageal reflux disease without esophagitis: Secondary | ICD-10-CM

## 2014-07-08 DIAGNOSIS — I340 Nonrheumatic mitral (valve) insufficiency: Secondary | ICD-10-CM

## 2014-07-08 DIAGNOSIS — I10 Essential (primary) hypertension: Secondary | ICD-10-CM

## 2014-07-08 NOTE — Progress Notes (Signed)
[name] [name], female   DOB: 09/14/1940, 73 y.o.   MRN: [id number]    HPI: [name] [name] is a 73 y.o. female who presents to the office for a one-year cardiology evaluation.  [name] [name] has a history of hypertension, hyperlipidemia, mild obesity and GERD. An echo Doppler study in 2012 performend after she experienced exertional shortness of while visiting her daughter in [place] revealed normal systolic function with mild mitral annular calcification mild MR, mild TR, and mild aortic valve sclerosis. She has a history of hyperkalemia in the past.   Over the past year, she has been maintained on amlodipine 5 mg and hydrochlorothiazide 12.5 mg for hypertension.  She is on simvastatin 20 mg and fish oil 2 g daily for hyperlipidemia.  She takes omeprazole 20 mg for GERD.  She takes 81 mg aspirin.  She has a remote history of kidney stones and has been without recurrence.  In March 2015 a three-year follow-up echo Doppler study confirmed normal systolic function with an ejection fraction of 60-65%.  She had grade 1 diastolic dysfunction.  There was mild aortic and mitral insufficiency as well as tricuspid insufficiency.  There was mild dilatation of her left atrium.  I have reviewed recent laboratory that she had done at [facility] on 07/04/2014.  Glucose was normal at 91.  She normal renal function with a BUN of 12, guarding 0.82.  LFTs were normal.  She is on vitamin D 2000 mg daily and her vitamin D level was normal at 38.5.  Lipid studies were good with a total cholesterol 153, triglycerides 83, HDL 54, LDL 82.  She presents for evaluation  Past Medical History  Diagnosis Date  . Hypertension   . Hyperlipidemia   . Kidney stones   . Diverticulosis 2008  . Obesity   . Vitamin D deficiency   . Kidney stones   . GERD (gastroesophageal reflux disease)   . Hx of echocardiogram 10/2010    which revealed normal systolic as well as diastolic function. she did have mild mitral annular  calcification with mild MR and TR. She had mild aortic sclerosis without stenosis.  [name] History of stress test 10/2010    which she execised a 7 met workload achieving a peak heart rate of 141, representing 93% of predicted maximum. Scintigraphic images revealed ormal perfusion.    Past Surgical History  Procedure Laterality Date  . Kidney stone surgery    . Orthroscopic rt knee      No Known Allergies  Current Outpatient Prescriptions  Medication Sig Dispense Refill  . amLODipine (NORVASC) 5 MG tablet Take 5 mg by mouth daily.    [name] aspirin EC 81 MG tablet Take 81 mg by mouth daily.    . Cholecalciferol (VITAMIN D3) 2000 UNITS capsule Take 2,000 Units by mouth daily.    . fish oil-omega-3 fatty acids 1000 MG capsule Take 2 g by mouth daily.    . hydrochlorothiazide (HYDRODIURIL) 25 MG tablet Take 12.5 mg by mouth daily.     . nitroGLYCERIN (NITROSTAT) 0.4 MG SL tablet Place 0.4 mg under the tongue every 5 (five) minutes as needed.    [name] omeprazole (PRILOSEC) 20 MG capsule Take 20 mg by mouth daily.    . simvastatin (ZOCOR) 20 MG tablet Take 1 tablet (20 mg total) by mouth at bedtime. 30 tablet 6  . ZOSTAVAX 56213 UNT/0.65ML injection      No current facility-administered medications for this visit.    History  Social History  . Marital Status: Widowed    Spouse Name: N/A    Number of Children: 2  . Years of Education: N/A   Occupational History  . Retired    Social History Main Topics  . Smoking status: Never Smoker   . Smokeless tobacco: Never Used  . Alcohol Use: Yes     Comment: wine occ  . Drug Use: No  . Sexual Activity: No   Other Topics Concern  . Not on file   Social History Narrative  . No narrative on file   Socially she is widowed and has 2 children 4 grandchildren 2 great-grandchildren. There is no tobacco or alcohol history per  Family History  Problem Relation Age of Onset  . Heart disease Mother   . Dementia Mother   . Lung cancer Father   .  Stroke Maternal Grandmother   . Heart disease Maternal Grandmother   . Colon cancer Neg Hx   . Esophageal cancer Neg Hx   . Rectal cancer Neg Hx   . Stomach cancer Neg Hx     ROS General: Negative; No fevers, chills, or night sweats;  HEENT: Negative; No changes in vision or hearing, sinus congestion, difficulty swallowing Pulmonary: Negative; No cough, wheezing, shortness of breath, hemoptysis Cardiovascular: Negative; No chest pain, presyncope, syncope, palpitations GI: Positive for GERD, stable; No nausea, vomiting, diarrhea, or abdominal pain GU: Negative; No dysuria, hematuria, or difficulty voiding Musculoskeletal: Negative; no myalgias, joint pain, or weakness Hematologic/Oncology: Negative; no easy bruising, bleeding Endocrine: Negative; no heat/cold intolerance; no diabetes Neuro: Negative; no changes in balance, headaches Skin: Negative; No rashes or skin lesions Psychiatric: Negative; No behavioral problems, depression Sleep: Negative; No snoring, daytime sleepiness, hypersomnolence, bruxism, restless legs, hypnogognic hallucinations, no cataplexy Other comprehensive 14 point system review is negative.  PE BP 144/87 mmHg  Pulse 78  Ht 5' (1.524 m)  Wt 184 lb (83.462 kg)  BMI 35.94 kg/m2  General: Alert, oriented, no distress.  Skin: normal turgor, no rashes HEENT: Normocephalic, atraumatic. Pupils round and reactive; sclera anicteric;no lid lag.  Nose without nasal septal hypertrophy Mouth/Parynx benign; Mallinpatti scale 2 Neck: No JVD, no carotid bruits with normal carotid upstroke Lungs: clear to ausculatation and percussion; no wheezing or rales Chest wall: Nontender to palpation Heart: RRR, s1 s2 normal 1/6 systolic murmur; no appreciable diastolic murmur.  No S3 gallop.  No rubs thrills or heaves Abdomen: soft, nontender; no hepatosplenomehaly, BS+; abdominal aorta nontender and not dilated by palpation. Back: No CVA tenderness Pulses 2+ Extremities: no  clubbing cyanosis or edema, Homan's sign negative  Neurologic: grossly nonfocal Psychological: Normal affect and mood  ECG (independently read by me): Normal sinus rhythm at 78 bpm.  Nonspecific T-wave abnormalities, not significant change.  QTc interval 446 ms.  September 2014 ECG: Sinus rhythm at 72 beats per minute;  nonspecific T changes  LABS:  BMET    Component Value Date/Time   NA 141 02/07/2012 2016   K 3.0* 02/07/2012 2016   CL 99 02/07/2012 2016   CO2 30 02/07/2012 2016   GLUCOSE 93 02/07/2012 2016   BUN 14 02/07/2012 2016   CREATININE 0.70 02/07/2012 2016   CALCIUM 9.7 02/07/2012 2016   GFRNONAA 85* 02/07/2012 2016   GFRAA >90 02/07/2012 2016     Hepatic Function Panel     Component Value Date/Time   PROT 7.0 11/29/2013 1034     CBC    Component Value Date/Time   WBC 8.6 02/07/2012 2016  RBC 4.61 02/07/2012 2016   HGB 14.6 02/07/2012 2016   HCT 41.2 02/07/2012 2016   PLT 151 02/07/2012 2016   MCV 89.4 02/07/2012 2016   MCH 31.7 02/07/2012 2016   MCHC 35.4 02/07/2012 2016   RDW 12.8 02/07/2012 2016   LYMPHSABS 2.3 02/07/2012 2016   MONOABS 0.6 02/07/2012 2016   EOSABS 0.1 02/07/2012 2016   BASOSABS 0.0 02/07/2012 2016     BNP No results found for: PROBNP  Lipid Panel     Component Value Date/Time   CHOL 145 11/29/2013 1034     RADIOLOGY: No results found.    ASSESSMENT AND PLAN:  Ms. Cataldi is a 73 year old female who has a history of hypertension,, hyperlipidemia, mild obesity, GERD.  Her blood pressure today on repeat by me was 140/76 on her current dose of amlodipine 5 mg and HCTZ 12.5 mg.  She's not having any signs of edema.  I reviewed her recent laboratory in renal function remains normal.  Her lipid studies were removed and on her current dose of senna stent 20 mg LDL cholesterol is 82 with a total cholesterol 153.  She does have mild obesity.  Body mass index today is 35.94.  Her weight today is 184 pounds which is a 3 pound  weight gain for when I see her 1 year ago.  I discussed weight loss [place] to get under the "obesity "threshold.  She's been taking care of her 2 grandchildren and keeps busy but does not regularly exercise.  I suggested ideally exercising 5 days per week if at all possible.  Her GERD is stable on her current dose of Prilosec.  She is on antiplatelet therapy with low-dose aspirin.  Denies any bleeding.  I reviewed her echo Doppler study in detail with her.  She continues have normal systolic function but has grade 1 diastolic dysfunction.  There was mild regurgitation of her aortic, mitral, and tricuspid valves.  I will see her one year for cardiology evaluation.  Time spent: 25 minutes  Troy Sine, MD, [facility]  07/08/2014 8:08 AM

## 2014-07-08 NOTE — Patient Instructions (Signed)
Your physician wants you to follow-up in: 1 year or sooner if needed with Dr. Tresa EndoKelly. No changes have been made today in your therapy. You will receive a reminder letter in the mail two months in advance. If you don't receive a letter, please call our office to schedule the follow-up appointment.

## 2014-07-14 ENCOUNTER — Encounter: Payer: Self-pay | Admitting: Cardiovascular Disease

## 2014-12-25 ENCOUNTER — Other Ambulatory Visit: Payer: Self-pay

## 2014-12-25 DIAGNOSIS — Z1231 Encounter for screening mammogram for malignant neoplasm of breast: Secondary | ICD-10-CM

## 2015-01-13 ENCOUNTER — Ambulatory Visit
Admission: RE | Admit: 2015-01-13 | Discharge: 2015-01-13 | Disposition: A | Discharge status: Home / Self Care | Payer: Medicare Other | Source: Ambulatory Visit

## 2015-01-13 DIAGNOSIS — Z1231 Encounter for screening mammogram for malignant neoplasm of breast: Secondary | ICD-10-CM

## 2015-07-08 ENCOUNTER — Ambulatory Visit (INDEPENDENT_AMBULATORY_CARE_PROVIDER_SITE_OTHER): Payer: Medicare Other | Admitting: Cardiovascular Disease

## 2015-07-08 VITALS — BP 130/76 | HR 77 | Ht 65.0 in | Wt 186.0 lb

## 2015-07-08 DIAGNOSIS — I340 Nonrheumatic mitral (valve) insufficiency: Secondary | ICD-10-CM

## 2015-07-08 DIAGNOSIS — E785 Hyperlipidemia, unspecified: Secondary | ICD-10-CM

## 2015-07-08 DIAGNOSIS — I10 Essential (primary) hypertension: Secondary | ICD-10-CM

## 2015-07-08 DIAGNOSIS — K219 Gastro-esophageal reflux disease without esophagitis: Secondary | ICD-10-CM | POA: Diagnosis not present

## 2015-07-08 DIAGNOSIS — I351 Nonrheumatic aortic (valve) insufficiency: Secondary | ICD-10-CM | POA: Diagnosis not present

## 2015-07-08 NOTE — Patient Instructions (Signed)
Your physician wants you to follow-up in: 1 Year. You will receive a reminder letter in the mail two months in advance. If you don't receive a letter, please call our office to schedule the follow-up appointment.  If you need a refill on your cardiac medications before your next appointment, please call your pharmacy.     Happy Thanksgiving!!!  

## 2015-07-10 ENCOUNTER — Encounter: Payer: Self-pay | Admitting: Cardiovascular Disease

## 2015-07-10 DIAGNOSIS — I10 Essential (primary) hypertension: Secondary | ICD-10-CM | POA: Insufficient documentation

## 2015-07-10 NOTE — Progress Notes (Signed)
Patient ID: [name], female   DOB: 29-May-1941, 74 y.o.   MRN: [id number]    HPI: [name] [name] is a 74 y.o. female who presents to the office for a one-year cardiology evaluation.  [name] [name] has a history of hypertension, hyperlipidemia, mild obesity and GERD. An echo Doppler study in 2012 performend after she experienced exertional shortness of while visiting her daughter in [place] revealed normal systolic function with mild mitral annular calcification mild MR, mild TR, and mild aortic valve sclerosis. She has a history of hyperkalemia in the past.   Over the past year, she has been maintained on amlodipine 5 mg and hydrochlorothiazide 12.5 mg for hypertension.  She is on simvastatin 20 mg and fish oil 2 g daily for hyperlipidemia.  She takes omeprazole 20 mg for GERD.  She takes 81 mg aspirin.  She has a remote history of kidney stones and has been without recurrence.  In March 2015 a three-year follow-up echo Doppler study confirmed normal systolic function with an ejection fraction of 60-65%.  She had grade 1 diastolic dysfunction.  There was mild aortic and mitral insufficiency as well as tricuspid insufficiency.  There was mild dilatation of her left atrium.  Laboratory done at [facility] on 07/04/2014 was again reviewed.  She has not had recent blood work.  Glucose was normal at 91.  She normal renal function with a BUN of 12, guarding 0.82.  LFTs were normal.  She is on vitamin D 2000 mg daily and her vitamin D level was normal at 38.5.  Lipid studies were good with a total cholesterol 153, triglycerides 83, HDL 54, LDL 82.   She denies any chest pain or shortness of breath.  Yesterday she states she thought she may have had a very slight fever in the early morning, but this normalized.  She denies PND or orthopnea.  She denies palpitations.  Past Medical History  Diagnosis Date  . Hypertension   . Hyperlipidemia   . Kidney stones   . Diverticulosis 2008  . Obesity   . Vitamin  D deficiency   . Kidney stones   . GERD (gastroesophageal reflux disease)   . Hx of echocardiogram 10/2010    which revealed normal systolic as well as diastolic function. she did have mild mitral annular calcification with mild MR and TR. She had mild aortic sclerosis without stenosis.  [name] History of stress test 10/2010    which she execised a 7 met workload achieving a peak heart rate of 141, representing 93% of predicted maximum. Scintigraphic images revealed ormal perfusion.    Past Surgical History  Procedure Laterality Date  . Kidney stone surgery    . Orthroscopic rt knee      No Known Allergies  Current Outpatient Prescriptions  Medication Sig Dispense Refill  . amLODipine (NORVASC) 5 MG tablet Take 5 mg by mouth daily.    [name] aspirin EC 81 MG tablet Take 81 mg by mouth daily.    . Cholecalciferol (VITAMIN D3) 2000 UNITS capsule Take 2,000 Units by mouth daily.    . fish oil-omega-3 fatty acids 1000 MG capsule Take 2 g by mouth daily.    . hydrochlorothiazide (HYDRODIURIL) 25 MG tablet Take 12.5 mg by mouth daily.     . nitroGLYCERIN (NITROSTAT) 0.4 MG SL tablet Place 0.4 mg under the tongue every 5 (five) minutes as needed.    [name] omeprazole (PRILOSEC) 20 MG capsule Take 20 mg by mouth as needed.     [name]  simvastatin (ZOCOR) 20 MG tablet Take 1 tablet (20 mg total) by mouth at bedtime. 30 tablet 6   No current facility-administered medications for this visit.    Social History   Social History  . Marital Status: Widowed    Spouse Name: N/A  . Number of Children: 2  . Years of Education: N/A   Occupational History  . Retired    Social History Main Topics  . Smoking status: Never Smoker   . Smokeless tobacco: Never Used  . Alcohol Use: Yes     Comment: wine occ  . Drug Use: No  . Sexual Activity: No   Other Topics Concern  . Not on file   Social History Narrative   Socially she is widowed and has 2 children 4 grandchildren 2 great-grandchildren. There is no  tobacco or alcohol history.  Family History  Problem Relation Age of Onset  . Heart disease Mother   . Dementia Mother   . Lung cancer Father   . Stroke Maternal Grandmother   . Heart disease Maternal Grandmother   . Colon cancer Neg Hx   . Esophageal cancer Neg Hx   . Rectal cancer Neg Hx   . Stomach cancer Neg Hx     ROS General: Negative; No fevers, chills, or night sweats;  HEENT: Negative; No changes in vision or hearing, sinus congestion, difficulty swallowing Pulmonary: Negative; No cough, wheezing, shortness of breath, hemoptysis Cardiovascular: Negative; No chest pain, presyncope, syncope, palpitations GI: Positive for GERD, stable; No nausea, vomiting, diarrhea, or abdominal pain GU: Negative; No dysuria, hematuria, or difficulty voiding Musculoskeletal: Negative; no myalgias, joint pain, or weakness Hematologic/Oncology: Negative; no easy bruising, bleeding Endocrine: Negative; no heat/cold intolerance; no diabetes Neuro: Negative; no changes in balance, headaches Skin: Negative; No rashes or skin lesions Psychiatric: Negative; No behavioral problems, depression Sleep: Negative; No snoring, daytime sleepiness, hypersomnolence, bruxism, restless legs, hypnogognic hallucinations, no cataplexy Other comprehensive 14 point system review is negative.  PE BP 130/76 mmHg  Pulse 77  Ht 5' 5"  (1.651 m)  Wt 186 lb (84.369 kg)  BMI 30.95 kg/m2   Repeat blood pressure by me 118/75.  Wt Readings from Last 3 Encounters:  07/08/15 186 lb (84.369 kg)  07/08/14 184 lb (83.462 kg)  11/21/13 179 lb (81.194 kg)   General: Alert, oriented, no distress.  Skin: normal turgor, no rashes HEENT: Normocephalic, atraumatic. Pupils round and reactive; sclera anicteric;no lid lag.  Nose without nasal septal hypertrophy Mouth/Parynx benign; Mallinpatti scale 2 Neck: No JVD, no carotid bruits with normal carotid upstroke Lungs: clear to ausculatation and percussion; no wheezing or  rales Chest wall: Nontender to palpation Heart: RRR, s1 s2 normal 1/6 systolic murmur; no appreciable diastolic murmur.  No S3 gallop.  No rubs thrills or heaves Abdomen: soft, nontender; no hepatosplenomehaly, BS+; abdominal aorta nontender and not dilated by palpation. Back: No CVA tenderness Pulses 2+ Extremities: no clubbing cyanosis or edema, Homan's sign negative  Neurologic: grossly nonfocal Psychological: Normal affect and mood  ECG (independently read by me): Normal sinus rhythm at 77 bpm.  No significant ST changes.  Normal intervals.  November 2015 ECG (independently read by me): Normal sinus rhythm at 78 bpm.  Nonspecific T-wave abnormalities, not significant change.  QTc interval 446 ms.  September 2014 ECG: Sinus rhythm at 72 beats per minute;  nonspecific T changes  LABS:  BMP Latest Ref Rng 02/07/2012 10/10/2011 10/09/2011  Glucose 70 - 99 mg/dL 93 87 90  BUN 6 - 23  mg/dL 14 11 11   Creatinine 0.50 - 1.10 mg/dL 0.70 0.75 0.68  Sodium 135 - 145 mEq/L 141 143 141  Potassium 3.5 - 5.1 mEq/L 3.0(L) 4.0 3.1(L)  Chloride 96 - 112 mEq/L 99 104 101  CO2 19 - 32 mEq/L 30 31 28   Calcium 8.4 - 10.5 mg/dL 9.7 10.0 10.5   Hepatic Function Latest Ref Rng 11/29/2013  Total Protein 6.0 - 8.3 g/dL 7.0  Albumin 3.5 - 5.2 g/dL 4.2  AST 0 - 37 U/L 19  ALT 0 - 35 U/L 15  Alk Phosphatase 39 - 117 U/L 78  Total Bilirubin 0.2 - 1.2 mg/dL 0.8  Bilirubin, Direct 0.0 - 0.3 mg/dL 0.2   CBC Latest Ref Rng 02/07/2012 10/10/2011 10/09/2011  WBC 4.0 - 10.5 K/uL 8.6 6.6 7.8  Hemoglobin 12.0 - 15.0 g/dL 14.6 14.4 13.6  Hematocrit 36.0 - 46.0 % 41.2 41.8 40.3  Platelets 150 - 400 K/uL 151 151 140(L)   Lab Results  Component Value Date   MCV 89.4 02/07/2012   MCV 91.7 10/10/2011   MCV 90.0 10/09/2011   No results found for: TSH   Lipid Panel     Component Value Date/Time   CHOL 145 11/29/2013 1034   CHOL 181 05/20/2013 0909   TRIG 77 11/29/2013 1034   TRIG 86 05/20/2013 0909   HDL 52  11/29/2013 1034   HDL 49 05/20/2013 0909   CHOLHDL 2.8 11/29/2013 1034   VLDL 15 11/29/2013 1034   LDLCALC 78 11/29/2013 1034   LDLCALC 115* 05/20/2013 0909    RADIOLOGY: No results found.    ASSESSMENT AND PLAN: Ms. Goral is a 74 year old female who has a history of hypertension,, hyperlipidemia, mild obesity, and GERD.  Her blood pressure today was well controlled on her current dose of amlodipine 5 mg and HCTZ 12.5 mg.  She is not having any signs of edema.  She has documented mild mitral and aortic insufficiency and tricuspid valves and has documented normal systolic function with grade 1 diastolic dysfunction.  I reviewed her recent laboratory in renal function remains normal.  She has not had recent lab work but tells me this will be done by her primary physician.  I will ask that these be sent to me for my review.  I again reviewed blood work from last year.  She continues to tolerate simvastatin for hyperlipidemia and denies any myalgias.  Her GERD is well controlled with omeprazole.  Her ECG remained stable.  I have discussed the importance of exercising at least 5 days per week.  As long as she remains stable, she will continue her current medical regimen and I will see her one year for reevaluation.  Time spent: 25 minutes Troy Sine, MD, [facility]  07/10/2015 9:44 PM

## 2015-12-17 ENCOUNTER — Other Ambulatory Visit (HOSPITAL_BASED_OUTPATIENT_CLINIC_OR_DEPARTMENT_OTHER): Payer: Self-pay | Admitting: Family Medicine

## 2015-12-17 ENCOUNTER — Ambulatory Visit (HOSPITAL_BASED_OUTPATIENT_CLINIC_OR_DEPARTMENT_OTHER)
Admission: RE | Admit: 2015-12-17 | Discharge: 2015-12-17 | Disposition: A | Discharge status: Home / Self Care | Payer: Medicare Other | Source: Ambulatory Visit | Attending: Family Medicine | Admitting: Family Medicine

## 2015-12-17 DIAGNOSIS — M79605 Pain in left leg: Secondary | ICD-10-CM

## 2016-01-01 ENCOUNTER — Other Ambulatory Visit: Payer: Self-pay

## 2016-01-01 DIAGNOSIS — Z1231 Encounter for screening mammogram for malignant neoplasm of breast: Secondary | ICD-10-CM

## 2016-01-22 ENCOUNTER — Ambulatory Visit: Payer: Medicare Other

## 2016-02-02 ENCOUNTER — Ambulatory Visit
Admission: RE | Admit: 2016-02-02 | Discharge: 2016-02-02 | Disposition: A | Discharge status: Home / Self Care | Payer: Medicare Other | Source: Ambulatory Visit

## 2016-02-02 DIAGNOSIS — Z1231 Encounter for screening mammogram for malignant neoplasm of breast: Secondary | ICD-10-CM

## 2016-07-19 ENCOUNTER — Encounter: Payer: Self-pay | Admitting: Cardiovascular Disease

## 2016-07-19 ENCOUNTER — Ambulatory Visit (INDEPENDENT_AMBULATORY_CARE_PROVIDER_SITE_OTHER): Payer: Medicare Other | Admitting: Cardiovascular Disease

## 2016-07-19 VITALS — BP 132/79 | HR 83 | Ht 65.0 in | Wt 187.8 lb

## 2016-07-19 DIAGNOSIS — I10 Essential (primary) hypertension: Secondary | ICD-10-CM

## 2016-07-19 DIAGNOSIS — K219 Gastro-esophageal reflux disease without esophagitis: Secondary | ICD-10-CM

## 2016-07-19 DIAGNOSIS — I340 Nonrheumatic mitral (valve) insufficiency: Secondary | ICD-10-CM | POA: Diagnosis not present

## 2016-07-19 DIAGNOSIS — E785 Hyperlipidemia, unspecified: Secondary | ICD-10-CM | POA: Diagnosis not present

## 2016-07-19 DIAGNOSIS — I351 Nonrheumatic aortic (valve) insufficiency: Secondary | ICD-10-CM

## 2016-07-19 NOTE — Patient Instructions (Signed)
Your physician recommends that you schedule a follow-up appointment and echo in May 2018.

## 2016-07-21 NOTE — Progress Notes (Signed)
Patient ID: [name] [name], female   DOB: 08-27-1940, 75 y.o.   MRN: [id number]    HPI: [name] [name] is a 75 y.o. female who presents to the office for a one-year cardiology evaluation.  [name] [name] has a history of hypertension, hyperlipidemia, mild obesity and GERD. An echo Doppler study in 2012 performend after she experienced exertional shortness of while visiting her daughter in [place] revealed normal systolic function with mild mitral annular calcification mild MR, mild TR, and mild aortic valve sclerosis. She has a history of hyperkalemia in the past.   In March 2015 a three-year follow-up echo Doppler study confirmed normal systolic function with an ejection fraction of 60-65%.  She had grade 1 diastolic dysfunction.  There was mild aortic and mitral insufficiency as well as tricuspid insufficiency.  There was mild dilatation of her left atrium.  Laboratory done at [facility] on 07/04/2014 : Glucose was normal at 91.  She normal renal function with a BUN of 12, guarding 0.82.  LFTs were normal.  She is on vitamin D 2000 mg daily and her vitamin D level was normal at 38.5.  Lipid studies were good with a total cholesterol 153, triglycerides 83, HDL 54, LDL 82.  Since I last saw her one year ago, she has had left knee problems.  She has undergone an MRI and has seen Dr. Berenice Primas.  In May 2017 she developed left lower extremity pain radiating to her calf.  She underwent lower extremity venous duplex imaging which was negative for DVT.  There was no reflux.  She denies any chest pain or shortness of breath.  She denies PND or orthopnea.  She denies palpitations.  Over the past year, she has been maintained on amlodipine 5 mg and hydrochlorothiazide 12.5 mg for hypertension.  She is on simvastatin 20 mg and fish oil 2 g daily for hyperlipidemia.  She takes omeprazole 20 mg for GERD.  She takes 81 mg aspirin.  She has a remote history of kidney stones and has been without recurrence.  She presents  for a follow-up evaluation.  She tells me her primary physician will be checking blood work.   Past Medical History:  Diagnosis Date  . Diverticulosis 2008  . GERD (gastroesophageal reflux disease)   . History of stress test 10/2010   which she execised a 7 met workload achieving a peak heart rate of 141, representing 93% of predicted maximum. Scintigraphic images revealed ormal perfusion.  [name] Hx of echocardiogram 10/2010   which revealed normal systolic as well as diastolic function. she did have mild mitral annular calcification with mild MR and TR. She had mild aortic sclerosis without stenosis.  . Hyperlipidemia   . Hypertension   . Kidney stones   . Kidney stones   . Obesity   . Vitamin D deficiency     Past Surgical History:  Procedure Laterality Date  . KIDNEY STONE SURGERY    . Orthroscopic Rt Knee      No Known Allergies  Current Outpatient Prescriptions  Medication Sig Dispense Refill  . amLODipine (NORVASC) 5 MG tablet Take 5 mg by mouth daily.    [name] aspirin EC 81 MG tablet Take 81 mg by mouth daily.    . Cholecalciferol (VITAMIN D3) 2000 UNITS capsule Take 2,000 Units by mouth daily.    . fish oil-omega-3 fatty acids 1000 MG capsule Take 2 g by mouth daily.    . hydrochlorothiazide (HYDRODIURIL) 25 MG tablet Take 12.5 mg by mouth  daily.     . meloxicam (MOBIC) 7.5 MG tablet     . nitroGLYCERIN (NITROSTAT) 0.4 MG SL tablet Place 0.4 mg under the tongue every 5 (five) minutes as needed.    [name] omeprazole (PRILOSEC) 20 MG capsule Take 20 mg by mouth as needed.     . simvastatin (ZOCOR) 20 MG tablet Take 1 tablet (20 mg total) by mouth at bedtime. 30 tablet 6   No current facility-administered medications for this visit.     Social History   Social History  . Marital status: Widowed    Spouse name: N/A  . Number of children: 2  . Years of education: N/A   Occupational History  . Retired    Social History Main Topics  . Smoking status: Never Smoker  .  Smokeless tobacco: Never Used  . Alcohol use Yes     Comment: wine occ  . Drug use: No  . Sexual activity: No   Other Topics Concern  . Not on file   Social History Narrative  . No narrative on file   Socially she is widowed and has 2 children 4 grandchildren 2 great-grandchildren. There is no tobacco or alcohol history.  Family History  Problem Relation Age of Onset  . Heart disease Mother   . Dementia Mother   . Lung cancer Father   . Stroke Maternal Grandmother   . Heart disease Maternal Grandmother   . Colon cancer Neg Hx   . Esophageal cancer Neg Hx   . Rectal cancer Neg Hx   . Stomach cancer Neg Hx     ROS General: Negative; No fevers, chills, or night sweats;  HEENT: Negative; No changes in vision or hearing, sinus congestion, difficulty swallowing Pulmonary: Negative; No cough, wheezing, shortness of breath, hemoptysis Cardiovascular: Negative; No chest pain, presyncope, syncope, palpitations GI: Positive for GERD, stable; No nausea, vomiting, diarrhea, or abdominal pain GU: Negative; No dysuria, hematuria, or difficulty voiding Musculoskeletal: Positive for left knee discomfort Hematologic/Oncology: Negative; no easy bruising, bleeding Endocrine: Negative; no heat/cold intolerance; no diabetes Neuro: Negative; no changes in balance, headaches Skin: Negative; No rashes or skin lesions Psychiatric: Negative; No behavioral problems, depression Sleep: Negative; No snoring, daytime sleepiness, hypersomnolence, bruxism, restless legs, hypnogognic hallucinations, no cataplexy Other comprehensive 14 point system review is negative.  PE BP 132/79   Pulse 83   Ht 5' 5"  (1.651 m)   Wt 187 lb 12.8 oz (85.2 kg)   BMI 31.25 kg/m    Repeat blood pressure 126/78  Wt Readings from Last 3 Encounters:  07/19/16 187 lb 12.8 oz (85.2 kg)  07/08/15 186 lb (84.4 kg)  07/08/14 184 lb (83.5 kg)   General: Alert, oriented, no distress.  Skin: normal turgor, no rashes HEENT:  Normocephalic, atraumatic. Pupils round and reactive; sclera anicteric;no lid lag.  Nose without nasal septal hypertrophy Mouth/Parynx benign; Mallinpatti scale 2 Neck: No JVD, no carotid bruits with normal carotid upstroke Lungs: clear to ausculatation and percussion; no wheezing or rales Chest wall: Nontender to palpation Heart: RRR, s1 s2 normal 1/6 systolic murmur; no appreciable diastolic murmur.  No S3 gallop.  No rubs thrills or heaves Abdomen: soft, nontender; no hepatosplenomehaly, BS+; abdominal aorta nontender and not dilated by palpation. Back: No CVA tenderness Pulses 2+ Extremities: no clubbing cyanosis or edema, Homan's sign negative  Neurologic: grossly nonfocal Psychological: Normal affect and mood  ECG (independently read by me): Normal sinus rhythm at 82 bpm.  No ectopy.  Normal intervals.  November  2016 ECG (independently read by me): Normal sinus rhythm at 77 bpm.  No significant ST changes.  Normal intervals.  November 2015 ECG (independently read by me): Normal sinus rhythm at 78 bpm.  Nonspecific T-wave abnormalities, not significant change.  QTc interval 446 ms.  September 2014 ECG: Sinus rhythm at 72 beats per minute;  nonspecific T changes  LABS:  BMP Latest Ref Rng & Units 02/07/2012 10/10/2011 10/09/2011  Glucose 70 - 99 mg/dL 93 87 90  BUN 6 - 23 mg/dL 14 11 11   Creatinine 0.50 - 1.10 mg/dL 0.70 0.75 0.68  Sodium 135 - 145 mEq/L 141 143 141  Potassium 3.5 - 5.1 mEq/L 3.0(L) 4.0 3.1(L)  Chloride 96 - 112 mEq/L 99 104 101  CO2 19 - 32 mEq/L 30 31 28   Calcium 8.4 - 10.5 mg/dL 9.7 10.0 10.5   Hepatic Function Latest Ref Rng & Units 11/29/2013  Total Protein 6.0 - 8.3 g/dL 7.0  Albumin 3.5 - 5.2 g/dL 4.2  AST 0 - 37 U/L 19  ALT 0 - 35 U/L 15  Alk Phosphatase 39 - 117 U/L 78  Total Bilirubin 0.2 - 1.2 mg/dL 0.8  Bilirubin, Direct 0.0 - 0.3 mg/dL 0.2   CBC Latest Ref Rng & Units 02/07/2012 10/10/2011 10/09/2011  WBC 4.0 - 10.5 K/uL 8.6 6.6 7.8  Hemoglobin  12.0 - 15.0 g/dL 14.6 14.4 13.6  Hematocrit 36.0 - 46.0 % 41.2 41.8 40.3  Platelets 150 - 400 K/uL 151 151 140(L)   Lab Results  Component Value Date   MCV 89.4 02/07/2012   MCV 91.7 10/10/2011   MCV 90.0 10/09/2011   No results found for: TSH   Lipid Panel     Component Value Date/Time   CHOL 145 11/29/2013 1034   CHOL 181 05/20/2013 0909   TRIG 77 11/29/2013 1034   TRIG 86 05/20/2013 0909   HDL 52 11/29/2013 1034   HDL 49 05/20/2013 0909   CHOLHDL 2.8 11/29/2013 1034   VLDL 15 11/29/2013 1034   LDLCALC 78 11/29/2013 1034   LDLCALC 115 (H) 05/20/2013 0909    RADIOLOGY: No results found.    ASSESSMENT AND PLAN: Ms. Reiger is a 75 year old female who has a history of hypertension, hyperlipidemia, mild obesity, and GERD.  Her blood pressure today is controlled on her current dose of amlodipine 5 mg and HCTZ 12.5 mg.  She is not having any signs of edema.  I reviewed her duplex scan from May and this was negative for DVT.  She continues to be on simvastatin 20 mg daily for hyperlipidemia and also has reduced her fish oil to just 500 mg daily.  He denies any associated myalgias.  In 2012 a 2-D echo Doppler study documented mild mitral and aortic insufficiency and tricuspid valves and has documented normal systolic function with grade 1 diastolic dysfunction.  She has not had recent lab work but tells me this will be done by her primary physician.  I will ask that these be sent to me for my review.  Her GERD is well controlled with omeprazole.  Her ECG remained stable.  I have discussed the importance of exercising at least 5 days per week.  She has had some issues with her left knee and is under evaluation by Dr. Berenice Primas.  She is mildly obese with a BMI of 31.25 and weight loss was recommended.  As long as she remains stable, she will continue her current medical regimen and I will see her one year  for reevaluation.  Time spent: 25 minutes Troy Sine, MD, [facility]  07/21/2016 4:28  PM

## 2016-08-05 ENCOUNTER — Encounter (HOSPITAL_BASED_OUTPATIENT_CLINIC_OR_DEPARTMENT_OTHER): Payer: Self-pay

## 2016-08-05 ENCOUNTER — Emergency Department (HOSPITAL_BASED_OUTPATIENT_CLINIC_OR_DEPARTMENT_OTHER)
Admission: EM | Admit: 2016-08-05 | Discharge: 2016-08-06 | Disposition: A | Discharge status: Home / Self Care | Payer: Medicare Other | Attending: Emergency Medicine | Admitting: Emergency Medicine

## 2016-08-05 DIAGNOSIS — Z7982 Long term (current) use of aspirin: Secondary | ICD-10-CM | POA: Insufficient documentation

## 2016-08-05 DIAGNOSIS — Z79899 Other long term (current) drug therapy: Secondary | ICD-10-CM | POA: Diagnosis not present

## 2016-08-05 DIAGNOSIS — M25562 Pain in left knee: Secondary | ICD-10-CM | POA: Diagnosis present

## 2016-08-05 DIAGNOSIS — I10 Essential (primary) hypertension: Secondary | ICD-10-CM | POA: Insufficient documentation

## 2016-08-05 HISTORY — DX: Other chronic pain: G89.29

## 2016-08-05 HISTORY — DX: Unspecified osteoarthritis, unspecified site: M19.90

## 2016-08-05 HISTORY — DX: Pain in unspecified knee: M25.569

## 2016-08-05 NOTE — ED Triage Notes (Signed)
C/o pain to left knee-MRI 11/24-dx with arthritis-cortisone inj on Monday ortho-states percocet is not controlling her pain tonight-NAD

## 2016-08-06 MED ORDER — HYDROMORPHONE HCL 1 MG/ML IJ SOLN
1.0000 mg | Freq: Once | INTRAMUSCULAR | Status: AC
  Administered 2016-08-06: 1 mg via INTRAMUSCULAR
  Filled 2016-08-06: qty 1

## 2016-08-06 MED ORDER — ONDANSETRON 8 MG PO TBDP
8.0000 mg | ORAL_TABLET | Freq: Once | ORAL | Status: AC
  Administered 2016-08-06: 8 mg via ORAL
  Filled 2016-08-06: qty 1

## 2016-08-06 MED ORDER — OXYCODONE-ACETAMINOPHEN 5-325 MG PO TABS: 1.0000 | ORAL_TABLET | Freq: Four times a day (QID) | ORAL | 0 refills | Status: DC | PRN

## 2016-08-06 NOTE — ED Provider Notes (Signed)
[place] DEPT [facility] Provider Note: Georgena Spurling, MD, FACEP  CSN: [id number] MRN: [id number] ARRIVAL: 08/05/16 at 2220 ROOM: [place]  Knee Pain   HISTORY OF PRESENT ILLNESS  [name] [name] is a 75 y.o. female with a history of osteoarthritis of the left knee. She had a cortisone injection 5 days ago. She had brief transient improvement in the pain but 2 days later the pain returned and has become severe. She states the pain is almost as severe as when she had a kidney stone. The pain is in the left knee joint and worse with movement or weightbearing. She has been taking Percocet 5 milligrams every 8 hours as prescribed. This is not provided adequate relief. She only has 3 tablets (she called her orthopedist who recommended she double up on her Percocet but as noted she is almost out. There is no swelling or erythema of the knee.   Past Medical History:  Diagnosis Date  . Arthritis   . Chronic knee pain   . Diverticulosis 2008  . GERD (gastroesophageal reflux disease)   . History of stress test 10/2010   which she execised a 7 met workload achieving a peak heart rate of 141, representing 93% of predicted maximum. Scintigraphic images revealed ormal perfusion.  [name] Hx of echocardiogram 10/2010   which revealed normal systolic as well as diastolic function. she did have mild mitral annular calcification with mild MR and TR. She had mild aortic sclerosis without stenosis.  . Hyperlipidemia   . Hypertension   . Kidney stones   . Kidney stones   . Obesity   . Vitamin D deficiency     Past Surgical History:  Procedure Laterality Date  . KIDNEY STONE SURGERY    . Orthroscopic Rt Knee      Family History  Problem Relation Age of Onset  . Heart disease Mother   . Dementia Mother   . Lung cancer Father   . Stroke Maternal Grandmother   . Heart disease Maternal Grandmother   . Colon cancer Neg Hx   . Esophageal cancer Neg Hx   . Rectal cancer Neg Hx   .  Stomach cancer Neg Hx     Social History  Substance Use Topics  . Smoking status: Never Smoker  . Smokeless tobacco: Never Used  . Alcohol use Yes     Comment: wine occ    Prior to Admission medications   Medication Sig Start Date End Date Taking? Authorizing Provider  oxyCODONE-acetaminophen (PERCOCET/ROXICET) 5-325 MG tablet Take by mouth every 4 (four) hours as needed for severe pain.   Yes Historical Provider, MD  amLODipine (NORVASC) 5 MG tablet Take 5 mg by mouth daily.    Historical Provider, MD  aspirin EC 81 MG tablet Take 81 mg by mouth daily.    Historical Provider, MD  Cholecalciferol (VITAMIN D3) 2000 UNITS capsule Take 2,000 Units by mouth daily.    Historical Provider, MD  fish oil-omega-3 fatty acids 1000 MG capsule Take 2 g by mouth daily.    Historical Provider, MD  hydrochlorothiazide (HYDRODIURIL) 25 MG tablet Take 12.5 mg by mouth daily.     Historical Provider, MD  simvastatin (ZOCOR) 20 MG tablet Take 1 tablet (20 mg total) by mouth at bedtime. 10/24/13   Troy Sine, MD    Allergies Patient has no known allergies.   REVIEW OF SYSTEMS  Negative except as noted here or in the History of Present Illness.  PHYSICAL EXAMINATION  Initial Vital Signs Blood pressure 153/86, pulse 77, temperature 98.6 F (37 C), temperature source Oral, resp. rate 24, height _0  (1.651 m), weight 178 lb 9.6 oz (81 kg), SpO2 94 %.  Examination General: Well-developed, well-nourished female in no acute distress; appearance consistent with age of record HENT: normocephalic; atraumatic Eyes: pupils equal, round and reactive to light; extraocular muscles intact Neck: supple Heart: regular rate and rhythm Lungs: clear to auscultation bilaterally Abdomen: soft; nondistended; nontender; bowel sounds present Extremities: No deformity; full range of motion except left knee; pulses normal; pain on palpation and attempted range of motion of left day without erythema, swelling or  effusion Neurologic: Awake, alert and oriented; motor function intact in all extremities and symmetric; no facial droop Skin: Warm and dry Psychiatric: Normal mood and affect   RESULTS  Summary of this visit's results, reviewed by myself:   EKG Interpretation  Date/Time:    Ventricular Rate:    PR Interval:    QRS Duration:   QT Interval:    QTC Calculation:   R Axis:     Text Interpretation:        Laboratory Studies: No results found for this or any previous visit (from the past 24 hour(s)). Imaging Studies: No results found.  ED COURSE  Nursing notes and initial vitals signs, including pulse oximetry, reviewed.  Vitals:   08/05/16 2247 08/05/16 2248  BP:  153/86  Pulse:  77  Resp:  24  Temp:  98.6 F (37 C)  TempSrc:  Oral  SpO2:  94%  Weight: 178 lb 9.6 oz (81 kg)   Height: _1  (1.651 m)    We'll increase patient's Percocet. She was advised to start on a laxative as Percocet can be very constipating.  PROCEDURES    ED DIAGNOSES     ICD-9-CM ICD-10-CM   1. Acute pain of left knee 719.46 M25.562        Shanon Rosser, MD 08/06/16 [phone]

## 2016-08-06 NOTE — ED Notes (Signed)
ED Provider at bedside. 

## 2016-08-18 ENCOUNTER — Other Ambulatory Visit: Payer: Self-pay | Admitting: Orthopedic Surgery

## 2016-08-18 ENCOUNTER — Encounter (HOSPITAL_BASED_OUTPATIENT_CLINIC_OR_DEPARTMENT_OTHER): Payer: Self-pay | Admitting: *Deleted

## 2016-08-18 NOTE — H&P (Addendum)
PREOPERATIVE H&P  Chief Complaint: left knee pain  HPI: [name] [name] is a 76 y.o. female who presents for evaluation of left knee pain. It has been present for greater than 3 months and has been worsening. She has failed conservative measures. Pain is rated as moderate.  Past Medical History:  Diagnosis Date  . Arthritis   . Chronic knee pain   . Diverticulosis 2008  . GERD (gastroesophageal reflux disease)   . History of stress test 10/2010   which she execised a 7 met workload achieving a peak heart rate of 141, representing 93% of predicted maximum. Scintigraphic images revealed ormal perfusion.  [name] Hx of echocardiogram 10/2010   which revealed normal systolic as well as diastolic function. she did have mild mitral annular calcification with mild MR and TR. She had mild aortic sclerosis without stenosis.  . Hyperlipidemia   . Hypertension   . Kidney stones   . Kidney stones   . Obesity   . Vitamin D deficiency    Past Surgical History:  Procedure Laterality Date  . KIDNEY STONE SURGERY    . Orthroscopic Rt Knee     Social History   Social History  . Marital status: Widowed    Spouse name: N/A  . Number of children: 2  . Years of education: N/A   Occupational History  . Retired    Social History Main Topics  . Smoking status: Never Smoker  . Smokeless tobacco: Never Used  . Alcohol use Yes     Comment: wine occ  . Drug use: No  . Sexual activity: Not Asked   Other Topics Concern  . None   Social History Narrative  . None   Family History  Problem Relation Age of Onset  . Heart disease Mother   . Dementia Mother   . Lung cancer Father   . Stroke Maternal Grandmother   . Heart disease Maternal Grandmother   . Colon cancer Neg Hx   . Esophageal cancer Neg Hx   . Rectal cancer Neg Hx   . Stomach cancer Neg Hx    No Known Allergies Prior to Admission medications   Medication Sig Start Date End Date Taking? Authorizing Provider  amLODipine (NORVASC)  5 MG tablet Take 5 mg by mouth daily.   Yes Historical Provider, MD  aspirin EC 81 MG tablet Take 81 mg by mouth daily.   Yes Historical Provider, MD  Cholecalciferol (VITAMIN D3) 2000 UNITS capsule Take 2,000 Units by mouth daily.   Yes Historical Provider, MD  fish oil-omega-3 fatty acids 1000 MG capsule Take 2 g by mouth daily.   Yes Historical Provider, MD  hydrochlorothiazide (HYDRODIURIL) 25 MG tablet Take 12.5 mg by mouth daily.    Yes Historical Provider, MD  oxyCODONE-acetaminophen (PERCOCET/ROXICET) 5-325 MG tablet Take 1-2 tablets by mouth every 6 (six) hours as needed (for pain). 08/06/16  Yes Daishawn Lauf Molpus, MD  simvastatin (ZOCOR) 20 MG tablet Take 1 tablet (20 mg total) by mouth at bedtime. 10/24/13  Yes Troy Sine, MD     Positive ROS: none  All other systems have been reviewed and were otherwise negative with the exception of those mentioned in the HPI and as above.  Physical Exam: There were no vitals filed for this visit. Vitals:   08/19/16 0756  BP: (!) 146/71  Pulse: 79  Resp: 18  Temp: 98.4 F (36.9 C)   General: Alert, no acute distress Cardiovascular: No pedal edema Respiratory: No cyanosis,  no use of accessory musculature GI: No organomegaly, abdomen is soft and non-tender Skin: No lesions in the area of chief complaint Neurologic: Sensation intact distally Psychiatric: Patient is competent for consent with normal mood and affect Lymphatic: No axillary or cervical lymphadenopathy  MUSCULOSKELETAL: left knee: Painful range of motion.  Tender to palpation over the medial joint line.  Positive McMurray.  Trace effusion.  Assessment/Plan: LEFT KNEE MEDIAL MENISCUS TEAR Plan for Procedure(s): KNEE ARTHROSCOPY WITH MENISCAL REPAIR  The risks benefits and alternatives were discussed with the patient including but not limited to the risks of nonoperative treatment, versus surgical intervention including infection, bleeding, nerve injury, malunion, nonunion,  hardware prominence, hardware failure, need for hardware removal, blood clots, cardiopulmonary complications, morbidity, mortality, among others, and they were willing to proceed.  Predicted outcome is good, although there will be at least a six to nine month expected recovery.  Reviewed H@P  and no changes  Jamayia Croker L, MD 08/18/2016 9:11 PM

## 2016-08-18 NOTE — Anesthesia Preprocedure Evaluation (Addendum)
Anesthesia Evaluation  Patient identified by MRN, date of birth, ID band Patient awake    Reviewed: Allergy & Precautions, H&P , NPO status , Patient's Chart, lab work & pertinent test results  Airway Mallampati: II  TM Distance: >3 FB Neck ROM: Full    Dental no notable dental hx. (+) Chipped, Dental Advisory Given   Pulmonary neg pulmonary ROS,    Pulmonary exam normal breath sounds clear to auscultation       Cardiovascular Exercise Tolerance: Good hypertension, Pt. on medications  Rhythm:Regular Rate:Normal     Neuro/Psych negative neurological ROS  negative psych ROS   GI/Hepatic Neg liver ROS, GERD  Controlled,  Endo/Other  negative endocrine ROS  Renal/GU Renal diseasenegative Renal ROS  negative genitourinary   Musculoskeletal  (+) Arthritis , Osteoarthritis,    Abdominal   Peds  Hematology negative hematology ROS (+)   Anesthesia Other Findings   Reproductive/Obstetrics negative OB ROS                            Anesthesia Physical Anesthesia Plan  ASA: II  Anesthesia Plan: General   Post-op Pain Management:    Induction: Intravenous  Airway Management Planned: LMA  Additional Equipment:   Intra-op Plan:   Post-operative Plan: Extubation in OR  Informed Consent: I have reviewed the patients History and Physical, chart, labs and discussed the procedure including the risks, benefits and alternatives for the proposed anesthesia with the patient or authorized representative who has indicated his/her understanding and acceptance.   Dental advisory given  Plan Discussed with: CRNA  Anesthesia Plan Comments:         Anesthesia Quick Evaluation

## 2016-08-19 ENCOUNTER — Ambulatory Visit (HOSPITAL_BASED_OUTPATIENT_CLINIC_OR_DEPARTMENT_OTHER): Payer: Medicare Other | Admitting: Anesthesiology

## 2016-08-19 ENCOUNTER — Ambulatory Visit (HOSPITAL_BASED_OUTPATIENT_CLINIC_OR_DEPARTMENT_OTHER)
Admission: RE | Admit: 2016-08-19 | Discharge: 2016-08-19 | Disposition: A | Discharge status: Home / Self Care | Payer: Medicare Other | Source: Ambulatory Visit | Attending: Orthopedic Surgery | Admitting: Orthopedic Surgery

## 2016-08-19 ENCOUNTER — Encounter (HOSPITAL_BASED_OUTPATIENT_CLINIC_OR_DEPARTMENT_OTHER)
Admission: RE | Disposition: A | Discharge status: Home / Self Care | Payer: Self-pay | Source: Ambulatory Visit | Attending: Orthopedic Surgery

## 2016-08-19 ENCOUNTER — Encounter (HOSPITAL_BASED_OUTPATIENT_CLINIC_OR_DEPARTMENT_OTHER): Payer: Self-pay | Admitting: Anesthesiology

## 2016-08-19 DIAGNOSIS — X58XXXA Exposure to other specified factors, initial encounter: Secondary | ICD-10-CM | POA: Diagnosis not present

## 2016-08-19 DIAGNOSIS — Z7982 Long term (current) use of aspirin: Secondary | ICD-10-CM | POA: Diagnosis not present

## 2016-08-19 DIAGNOSIS — S83242A Other tear of medial meniscus, current injury, left knee, initial encounter: Secondary | ICD-10-CM | POA: Diagnosis present

## 2016-08-19 DIAGNOSIS — M6752 Plica syndrome, left knee: Secondary | ICD-10-CM | POA: Diagnosis not present

## 2016-08-19 DIAGNOSIS — I10 Essential (primary) hypertension: Secondary | ICD-10-CM | POA: Insufficient documentation

## 2016-08-19 DIAGNOSIS — S83232A Complex tear of medial meniscus, current injury, left knee, initial encounter: Secondary | ICD-10-CM | POA: Insufficient documentation

## 2016-08-19 DIAGNOSIS — M2242 Chondromalacia patellae, left knee: Secondary | ICD-10-CM | POA: Diagnosis not present

## 2016-08-19 DIAGNOSIS — Z79899 Other long term (current) drug therapy: Secondary | ICD-10-CM | POA: Diagnosis not present

## 2016-08-19 HISTORY — PX: CHONDROPLASTY: SHX5177

## 2016-08-19 HISTORY — PX: KNEE ARTHROSCOPY WITH MEDIAL MENISECTOMY: SHX5651

## 2016-08-19 HISTORY — PX: KNEE ARTHROSCOPY WITH EXCISION PLICA: SHX5647

## 2016-08-19 LAB — POCT I-STAT, CHEM 8
BUN: 17 mg/dL (ref 6–20)
CHLORIDE: 102 mmol/L (ref 101–111)
CREATININE: 0.8 mg/dL (ref 0.44–1.00)
Calcium, Ion: 1.12 mmol/L — ABNORMAL LOW (ref 1.15–1.40)
Glucose, Bld: 80 mg/dL (ref 65–99)
HEMATOCRIT: 46 % (ref 36.0–46.0)
Hemoglobin: 15.6 g/dL — ABNORMAL HIGH (ref 12.0–15.0)
POTASSIUM: 3.3 mmol/L — AB (ref 3.5–5.1)
Sodium: 142 mmol/L (ref 135–145)
TCO2: 27 mmol/L (ref 0–100)

## 2016-08-19 SURGERY — ARTHROSCOPY, KNEE, WITH MEDIAL MENISCECTOMY
Anesthesia: General | Site: Knee | Laterality: Left

## 2016-08-19 MED ORDER — LIDOCAINE HCL (CARDIAC) 20 MG/ML IV SOLN
INTRAVENOUS | Status: DC | PRN
  Administered 2016-08-19: 40 mg via INTRAVENOUS

## 2016-08-19 MED ORDER — FENTANYL CITRATE (PF) 100 MCG/2ML IJ SOLN
INTRAMUSCULAR | Status: AC
  Filled 2016-08-19: qty 2

## 2016-08-19 MED ORDER — ONDANSETRON HCL 4 MG/2ML IJ SOLN
INTRAMUSCULAR | Status: AC
Start: 2016-08-19 — End: 2016-08-19
  Filled 2016-08-19: qty 2

## 2016-08-19 MED ORDER — PROPOFOL 500 MG/50ML IV EMUL
INTRAVENOUS | Status: AC
  Filled 2016-08-19: qty 50

## 2016-08-19 MED ORDER — DEXAMETHASONE SODIUM PHOSPHATE 4 MG/ML IJ SOLN
INTRAMUSCULAR | Status: DC | PRN
  Administered 2016-08-19: 10 mg via INTRAVENOUS

## 2016-08-19 MED ORDER — CEFAZOLIN SODIUM-DEXTROSE 2-4 GM/100ML-% IV SOLN
INTRAVENOUS | Status: AC
  Filled 2016-08-19: qty 100

## 2016-08-19 MED ORDER — DEXAMETHASONE SODIUM PHOSPHATE 10 MG/ML IJ SOLN
INTRAMUSCULAR | Status: AC
  Filled 2016-08-19: qty 1

## 2016-08-19 MED ORDER — LACTATED RINGERS IV SOLN
INTRAVENOUS | Status: DC
  Administered 2016-08-19: 08:00:00 via INTRAVENOUS

## 2016-08-19 MED ORDER — BUPIVACAINE HCL (PF) 0.5 % IJ SOLN
INTRAMUSCULAR | Status: AC
  Filled 2016-08-19: qty 30

## 2016-08-19 MED ORDER — LIDOCAINE 2% (20 MG/ML) 5 ML SYRINGE
INTRAMUSCULAR | Status: AC
  Filled 2016-08-19: qty 5

## 2016-08-19 MED ORDER — FENTANYL CITRATE (PF) 100 MCG/2ML IJ SOLN
50.0000 ug | INTRAMUSCULAR | Status: DC | PRN
  Administered 2016-08-19 (×2): 50 ug via INTRAVENOUS

## 2016-08-19 MED ORDER — HYDROMORPHONE HCL 1 MG/ML IJ SOLN: 0.2500 mg | INTRAMUSCULAR | Status: DC | PRN

## 2016-08-19 MED ORDER — CHLORHEXIDINE GLUCONATE 4 % EX LIQD: 60.0000 mL | Freq: Once | CUTANEOUS | Status: DC

## 2016-08-19 MED ORDER — CEFAZOLIN SODIUM-DEXTROSE 2-4 GM/100ML-% IV SOLN
2.0000 g | INTRAVENOUS | Status: AC
  Administered 2016-08-19: 2 g via INTRAVENOUS

## 2016-08-19 MED ORDER — METHYLPREDNISOLONE ACETATE 80 MG/ML IJ SUSP
INTRAMUSCULAR | Status: AC
  Filled 2016-08-19: qty 1

## 2016-08-19 MED ORDER — ONDANSETRON HCL 4 MG/2ML IJ SOLN
INTRAMUSCULAR | Status: DC | PRN
  Administered 2016-08-19: 4 mg via INTRAVENOUS

## 2016-08-19 MED ORDER — PROPOFOL 10 MG/ML IV BOLUS
INTRAVENOUS | Status: DC | PRN
  Administered 2016-08-19: 160 mg via INTRAVENOUS

## 2016-08-19 MED ORDER — OXYCODONE-ACETAMINOPHEN 5-325 MG PO TABS: 1.0000 | ORAL_TABLET | Freq: Four times a day (QID) | ORAL | 0 refills | Status: DC | PRN

## 2016-08-19 MED ORDER — MIDAZOLAM HCL 2 MG/2ML IJ SOLN: 1.0000 mg | INTRAMUSCULAR | Status: DC | PRN

## 2016-08-19 MED ORDER — BUPIVACAINE HCL (PF) 0.25 % IJ SOLN
INTRAMUSCULAR | Status: AC
  Filled 2016-08-19: qty 30

## 2016-08-19 MED ORDER — BUPIVACAINE HCL (PF) 0.5 % IJ SOLN
INTRAMUSCULAR | Status: DC | PRN
  Administered 2016-08-19: 20 mL

## 2016-08-19 MED ORDER — METHYLPREDNISOLONE ACETATE 40 MG/ML IJ SUSP
INTRAMUSCULAR | Status: AC
  Filled 2016-08-19: qty 1

## 2016-08-19 MED ORDER — SCOPOLAMINE 1 MG/3DAYS TD PT72: 1.0000 | MEDICATED_PATCH | Freq: Once | TRANSDERMAL | Status: DC | PRN

## 2016-08-19 MED ORDER — EPINEPHRINE 30 MG/30ML IJ SOLN
INTRAMUSCULAR | Status: AC
  Filled 2016-08-19: qty 1

## 2016-08-19 MED ORDER — SODIUM CHLORIDE 0.9 % IR SOLN
Status: DC | PRN
  Administered 2016-08-19: 6000 mL

## 2016-08-19 SURGICAL SUPPLY — 41 items
BANDAGE ACE 6X5 VEL STRL LF (GAUZE/BANDAGES/DRESSINGS) ×5 IMPLANT
BLADE 4.2CUDA (BLADE) IMPLANT
BLADE GREAT WHITE 4.2 (BLADE) ×4 IMPLANT
BLADE GREAT WHITE 4.2MM (BLADE) ×1
CUTTER MENISCUS  4.2MM (BLADE)
CUTTER MENISCUS 4.2MM (BLADE) IMPLANT
DRAPE ARTHROSCOPY W/POUCH 114 (DRAPES) ×5 IMPLANT
DRSG EMULSION OIL 3X3 NADH (GAUZE/BANDAGES/DRESSINGS) ×5 IMPLANT
DURAPREP 26ML APPLICATOR (WOUND CARE) ×5 IMPLANT
ELECT MENISCUS 165MM 90D (ELECTRODE) IMPLANT
ELECT REM PT RETURN 9FT ADLT (ELECTROSURGICAL)
ELECTRODE REM PT RTRN 9FT ADLT (ELECTROSURGICAL) IMPLANT
GAUZE SPONGE 4X4 12PLY STRL (GAUZE/BANDAGES/DRESSINGS) ×5 IMPLANT
GLOVE BIOGEL PI IND STRL 7.0 (GLOVE) ×3 IMPLANT
GLOVE BIOGEL PI IND STRL 8 (GLOVE) ×6 IMPLANT
GLOVE BIOGEL PI INDICATOR 7.0 (GLOVE) ×2
GLOVE BIOGEL PI INDICATOR 8 (GLOVE) ×4
GLOVE ECLIPSE 6.5 STRL STRAW (GLOVE) ×5 IMPLANT
GLOVE ECLIPSE 7.5 STRL STRAW (GLOVE) ×10 IMPLANT
GOWN STRL REUS W/ TWL LRG LVL3 (GOWN DISPOSABLE) ×3 IMPLANT
GOWN STRL REUS W/ TWL XL LVL3 (GOWN DISPOSABLE) ×3 IMPLANT
GOWN STRL REUS W/TWL LRG LVL3 (GOWN DISPOSABLE) ×2
GOWN STRL REUS W/TWL XL LVL3 (GOWN DISPOSABLE) ×7 IMPLANT
HOLDER KNEE FOAM BLUE (MISCELLANEOUS) ×5 IMPLANT
IV NS IRRIG 3000ML ARTHROMATIC (IV SOLUTION) ×10 IMPLANT
KNEE WRAP E Z 3 GEL PACK (MISCELLANEOUS) ×5 IMPLANT
MANIFOLD NEPTUNE II (INSTRUMENTS) IMPLANT
NDL SAFETY ECLIPSE 18X1.5 (NEEDLE) IMPLANT
NEEDLE HYPO 18GX1.5 SHARP (NEEDLE)
PACK ARTHROSCOPY DSU (CUSTOM PROCEDURE TRAY) ×5 IMPLANT
PACK BASIN DAY SURGERY FS (CUSTOM PROCEDURE TRAY) ×5 IMPLANT
PAD CAST 4YDX4 CTTN HI CHSV (CAST SUPPLIES) ×3 IMPLANT
PADDING CAST COTTON 4X4 STRL (CAST SUPPLIES) ×2
PENCIL BUTTON HOLSTER BLD 10FT (ELECTRODE) IMPLANT
SET ARTHROSCOPY TUBING (MISCELLANEOUS) ×2
SET ARTHROSCOPY TUBING LN (MISCELLANEOUS) ×3 IMPLANT
SUT ETHILON 4 0 PS 2 18 (SUTURE) IMPLANT
SYR 5ML LL (SYRINGE) ×5 IMPLANT
TOWEL OR 17X24 6PK STRL BLUE (TOWEL DISPOSABLE) ×5 IMPLANT
TOWEL OR NON WOVEN STRL DISP B (DISPOSABLE) ×5 IMPLANT
WATER STERILE IRR 1000ML POUR (IV SOLUTION) ×5 IMPLANT

## 2016-08-19 NOTE — Anesthesia Procedure Notes (Signed)
Procedure Name: LMA Insertion Performed by: Lindsay Straka W Pre-anesthesia Checklist: Patient identified, Emergency Drugs available, Suction available and Patient being monitored Patient Re-evaluated:Patient Re-evaluated prior to inductionOxygen Delivery Method: Circle system utilized Preoxygenation: Pre-oxygenation with 100% oxygen Intubation Type: IV induction Ventilation: Mask ventilation without difficulty LMA: LMA inserted LMA Size: 4.0 Number of attempts: 2 Placement Confirmation: positive ETCO2 Tube secured with: Tape Dental Injury: Teeth and Oropharynx as per pre-operative assessment        

## 2016-08-19 NOTE — Transfer of Care (Signed)
Immediate Anesthesia Transfer of Care Note  Patient: [name] SaverLynda G Sonneborn  Procedure(s) Performed: Procedure(s): KNEE ARTHROSCOPY WITH MEDIAL MENISECTOMY (Left) CHONDROPLASTY patellar femoral joint, medial femoral condyle KNEE ARTHROSCOPY WITH EXCISION PLICA (Left)  Patient Location: PACU  Anesthesia Type:General  Level of Consciousness: awake and sedated  Airway & Oxygen Therapy: Patient Spontanous Breathing and Patient connected to face mask oxygen  Post-op Assessment: Report given to RN and Post -op Vital signs reviewed and stable  Post vital signs: Reviewed and stable  Last Vitals:  Vitals:   08/19/16 0756 08/19/16 1000  BP: (!) 146/71 109/68  Pulse: 79   Resp: 18   Temp: 36.9 C     Last Pain:  Vitals:   08/19/16 0756  TempSrc: Oral  PainSc: 5       Patients Stated Pain Goal: 3 (08/19/16 0756)  Complications: No apparent anesthesia complications

## 2016-08-19 NOTE — Brief Op Note (Signed)
08/19/2016  1:17 PM  PATIENT:  [name] [name]  76 y.o. female  PRE-OPERATIVE DIAGNOSIS:  LEFT KNEE MEDIAL MENISCUS TEAR  POST-OPERATIVE DIAGNOSIS:  LEFT KNEE MEDIAL MENISCUS TEAR  PROCEDURE:  Procedure(s): KNEE ARTHROSCOPY WITH MEDIAL MENISECTOMY (Left) CHONDROPLASTY patellar femoral joint, medial femoral condyle KNEE ARTHROSCOPY WITH EXCISION PLICA (Left)  SURGEON:  Surgeon(s) and Role:    * Jodi GeraldsJohn Advaith Lamarque, MD - Primary  PHYSICIAN ASSISTANT:   ASSISTANTS: bethune   ANESTHESIA:   general  EBL:  Total I/O In: 1492 [P.O.:442; I.V.:1050] Out: 5 [Blood:5]  BLOOD ADMINISTERED:none  DRAINS: none   LOCAL MEDICATIONS USED:  MARCAINE     SPECIMEN:  No Specimen  DISPOSITION OF SPECIMEN:  N/A  COUNTS:  YES  TOURNIQUET:  * No tourniquets in log *  DICTATION: .Other Dictation: Dictation Number [id number]  PLAN OF CARE: Discharge to home after PACU  PATIENT DISPOSITION:  PACU - hemodynamically stable.   Delay start of Pharmacological VTE agent (>24hrs) due to surgical blood loss or risk of bleeding: no

## 2016-08-19 NOTE — Discharge Instructions (Signed)
POST-OP KNEE ARTHROSCOPY INSTRUCTIONS  °Dr. John Graves/Jim Bethune PA-C ° °Pain °You will be expected to have a moderate amount of pain in the affected knee for approximately two weeks. However, the first two days will be the most severe pain. A prescription has been provided to take as needed for the pain. The pain can be reduced by applying ice packs to the knee for the first 1-2 weeks post surgery. Also, keeping the leg elevated on pillows will help alleviate the pain. If you develop any acute pain or swelling in your calf muscle, please call the doctor. ° °Activity °It is preferred that you stay at bed rest for approximately 24 hours. However, you may go to the bathroom with help. Weight bearing as tolerated. You may begin the knee exercises the day of surgery. Discontinue crutches as the knee pain resolves. ° °Dressing °Keep the dressing dry. If the ace bandage should wrinkle or roll up, this can be rewrapped to prevent ridges in the bandage. You may remove all dressings in 48 hours,  apply bandaids to each wound. You may shower on the 4th day after surgery but no tub bath. ° °Symptoms to report to your doctor °Extreme pain °Extreme swelling °Temperature above 101 degrees °Change in the feeling, color, or movement of your toes °Redness, heat, or swelling at your incision ° °Exercise °If is preferred that as soon as possible you try to do a straight leg raise without bending the knee and concentrate on bringing the heel of your foot off the bed up to approximately 45 degrees and hold for the count of 10 seconds. Repeat this at least 10 times three or four times per day. Additional exercises are provided below. ° °You are encouraged to bend the knee as tolerated. ° °Follow-Up °Call to schedule a follow-up appointment in 5-7 days. Our office # is [phone]. ° °POST-OP EXERCISES ° °Short Arc Quads ° °1. Lie on back with legs straight. Place towel roll under thigh, just above knee. °2. Tighten thigh muscles to  straighten knee and lift heel off bed. °3. Hold for slow count of five, then lower. °4. Do three sets of ten ° ° ° °Straight Leg Raises ° °1. Lie on back with operative leg straight and other leg bent. °2. Keeping operative leg completely straight, slowly lift operative leg so foot is 5 inches off bed. °3. Hold for slow count of five, then lower. °4. Do three sets of ten. ° ° ° °DO BOTH EXERCISES 2 TIMES A DAY ° °Ankle Pumps ° °Work/move the operative ankle and foot up and down 10 times every hour while awake. ° ° °Post Anesthesia Home Care Instructions ° °Activity: °Get plenty of rest for the remainder of the day. A responsible adult should stay with you for 24 hours following the procedure.  °For the next 24 hours, DO NOT: °-Drive a car °-Operate machinery °-Drink alcoholic beverages °-Take any medication unless instructed by your physician °-Make any legal decisions or sign important papers. ° °Meals: °Start with liquid foods such as gelatin or soup. Progress to regular foods as tolerated. Avoid greasy, spicy, heavy foods. If nausea and/or vomiting occur, drink only clear liquids until the nausea and/or vomiting subsides. Call your physician if vomiting continues. ° °Special Instructions/Symptoms: °Your throat may feel dry or sore from the anesthesia or the breathing tube placed in your throat during surgery. If this causes discomfort, gargle with warm salt water. The discomfort should disappear within 24 hours. ° °If you   had a scopolamine patch placed behind your ear for the management of post- operative nausea and/or vomiting: ° °1. The medication in the patch is effective for 72 hours, after which it should be removed.  Wrap patch in a tissue and discard in the trash. Wash hands thoroughly with soap and water. °2. You may remove the patch earlier than 72 hours if you experience unpleasant side effects which may include dry mouth, dizziness or visual disturbances. °3. Avoid touching the patch. Wash your hands  with soap and water after contact with the patch. °  ° °

## 2016-08-19 NOTE — Anesthesia Postprocedure Evaluation (Signed)
Anesthesia Post Note  Patient: Terrilyn SaverLynda G Ardolino  Procedure(s) Performed: Procedure(s) (LRB): KNEE ARTHROSCOPY WITH MEDIAL MENISECTOMY (Left) CHONDROPLASTY patellar femoral joint, medial femoral condyle KNEE ARTHROSCOPY WITH EXCISION PLICA (Left)  Patient location during evaluation: PACU Anesthesia Type: General Level of consciousness: awake and alert Pain management: pain level controlled Vital Signs Assessment: post-procedure vital signs reviewed and stable Respiratory status: spontaneous breathing, nonlabored ventilation and respiratory function stable Cardiovascular status: blood pressure returned to baseline and stable Postop Assessment: no signs of nausea or vomiting Anesthetic complications: no       Last Vitals:  Vitals:   08/19/16 1041 08/19/16 1100  BP: 133/74 137/66  Pulse: 80 73  Resp: 14 16  Temp: 37.2 C     Last Pain:  Vitals:   08/19/16 1100  TempSrc:   PainSc: 0-No pain                 Govanni Plemons,W. EDMOND

## 2016-08-20 NOTE — Op Note (Signed)
[name]:  [name] [name], [name] [name]               ACCOUNT NO.:  [id number]  MEDICAL RECORD NO.:  [id number]  LOCATION:                                 FACILITY:  PHYSICIAN:  Harvie JuniorJohn L. Sherea Liptak, M.D.   DATE OF BIRTH:  September 29, 1940  DATE OF PROCEDURE:  08/19/2016 DATE OF DISCHARGE:  08/19/2016                              OPERATIVE REPORT   She is a 76 year old female in Orthopedic Surgery Service.  PREOPERATIVE DIAGNOSIS:  Medial meniscal tear.  POSTOPERATIVE DIAGNOSES: 1. Medial meniscal tear. 2. Chondromalacia, medial femoral condyle and patellofemoral joint. 3. Medial shelf plica.  PROCEDURE: 1. Arthroscopy with debridement of posterior horn, medial meniscal     tear. 2. Debridement of chondromalacia down to bleeding bone where necessary     in the medial compartment and patellofemoral compartment. 3. Medial shelf plica excision.  SURGEON:  Harvie JuniorJohn L. Hollyann Pablo, M.D.  ASSISTANT:  Marshia LyJames Bethune, P.A.  ANESTHESIA:  General.  BRIEF HISTORY:  Ms. [name] [name] is a 76 year old female with a long history of significant complaint of knee pain.  She had been treated conservatively for a prolonged period of time.  After failure of conservative care, she was taken to the operating room for left knee arthroscopy.  Preoperative MRI showed medial meniscal tear, and she had failed injection therapy and activity modification.  DESCRIPTION OF PROCEDURE:  The patient was taken to the operating room. After adequate anesthesia was obtained with general anesthetic, the patient was placed supine on the operating table.  The left leg was prepped and draped in sterile fashion.  Following this, routine arthroscopic examination of the knee revealed there was obvious chondromalacia in patellofemoral joint, which was debrided back to a smooth and stable rim articular cartilage just turned medially where there was a large draping medial plica blocking entrance into the medial compartment.  This was debrided back to the rim  of the capsule. Attention was then turned down into the medial joint line.  There was a complex tear of the posterior horn of the medial meniscus, which was debrided with a combination of straight biting forceps, upbiting forceps, and left handed side biter.  Once this was done, the remaining meniscal rim was contoured down with a suction shaver.  Medial femoral condyle was then addressed with a suction shaver and debrided down to a smooth and stable rim of articular cartilage.  Attention turned to the ACL, normal.  Attention turned to the lateral side, __________ a little frayed the meniscus, but nothing significant.  The lateral femoral condyle was pristine.  At this point, the knee was copiously and thoroughly irrigated and suctioned dry.  The sterile compressive dressing was applied.  A 20 mL 1% Marcaine was instilled in the knee for postoperative anesthesia, and the patient was then taken to the recovery room and noted to be in satisfactory condition.  Estimated blood loss during the procedure was minimal.     Harvie JuniorJohn L. Cletus Paris, M.D.     Ranae PlumberJLG/MEDQ  D:  08/19/2016  T:  08/20/2016  Job:  [id number]

## 2016-08-20 NOTE — Op Note (Deleted)
  The note originally documented on this encounter has been moved the the encounter in which it belongs.  

## 2016-08-22 ENCOUNTER — Encounter (HOSPITAL_BASED_OUTPATIENT_CLINIC_OR_DEPARTMENT_OTHER): Payer: Self-pay | Admitting: Orthopedic Surgery

## 2017-01-13 ENCOUNTER — Other Ambulatory Visit: Payer: Self-pay | Admitting: Family Medicine

## 2017-01-13 DIAGNOSIS — Z1231 Encounter for screening mammogram for malignant neoplasm of breast: Secondary | ICD-10-CM

## 2017-01-24 ENCOUNTER — Telehealth: Payer: Self-pay | Admitting: Cardiovascular Disease

## 2017-01-24 DIAGNOSIS — I358 Other nonrheumatic aortic valve disorders: Secondary | ICD-10-CM

## 2017-01-24 DIAGNOSIS — I351 Nonrheumatic aortic (valve) insufficiency: Secondary | ICD-10-CM

## 2017-01-24 DIAGNOSIS — I340 Nonrheumatic mitral (valve) insufficiency: Secondary | ICD-10-CM

## 2017-01-24 NOTE — Telephone Encounter (Signed)
Returned call to patient.She stated she needs echo scheduled and follow up visit with Dr.Kelly.Advised scheduler will call back to schedule appointments.

## 2017-01-24 NOTE — Telephone Encounter (Signed)
New message     Pt is trying to schedule Echo and there is no order in the computer. Thank you

## 2017-01-25 ENCOUNTER — Encounter (HOSPITAL_BASED_OUTPATIENT_CLINIC_OR_DEPARTMENT_OTHER): Payer: Self-pay | Admitting: *Deleted

## 2017-01-25 ENCOUNTER — Emergency Department (HOSPITAL_BASED_OUTPATIENT_CLINIC_OR_DEPARTMENT_OTHER): Payer: Medicare Other

## 2017-01-25 ENCOUNTER — Telehealth: Payer: Self-pay | Admitting: Cardiovascular Disease

## 2017-01-25 ENCOUNTER — Emergency Department (HOSPITAL_BASED_OUTPATIENT_CLINIC_OR_DEPARTMENT_OTHER)
Admission: EM | Admit: 2017-01-25 | Discharge: 2017-01-25 | Disposition: A | Discharge status: Home / Self Care | Payer: Medicare Other | Attending: Emergency Medicine | Admitting: Emergency Medicine

## 2017-01-25 DIAGNOSIS — Z79899 Other long term (current) drug therapy: Secondary | ICD-10-CM | POA: Diagnosis not present

## 2017-01-25 DIAGNOSIS — Z7982 Long term (current) use of aspirin: Secondary | ICD-10-CM | POA: Insufficient documentation

## 2017-01-25 DIAGNOSIS — R072 Precordial pain: Secondary | ICD-10-CM | POA: Diagnosis not present

## 2017-01-25 DIAGNOSIS — R911 Solitary pulmonary nodule: Secondary | ICD-10-CM | POA: Diagnosis not present

## 2017-01-25 DIAGNOSIS — R918 Other nonspecific abnormal finding of lung field: Secondary | ICD-10-CM

## 2017-01-25 DIAGNOSIS — R079 Chest pain, unspecified: Secondary | ICD-10-CM | POA: Diagnosis present

## 2017-01-25 DIAGNOSIS — I10 Essential (primary) hypertension: Secondary | ICD-10-CM | POA: Diagnosis not present

## 2017-01-25 LAB — CBC WITH DIFFERENTIAL/PLATELET
BASOS ABS: 0 10*3/uL (ref 0.0–0.1)
Basophils Relative: 0 %
EOS ABS: 0.1 10*3/uL (ref 0.0–0.7)
EOS PCT: 1 %
HCT: 43.7 % (ref 36.0–46.0)
HEMOGLOBIN: 15.3 g/dL — AB (ref 12.0–15.0)
LYMPHS PCT: 21 %
Lymphs Abs: 1.6 10*3/uL (ref 0.7–4.0)
MCH: 32.6 pg (ref 26.0–34.0)
MCHC: 35 g/dL (ref 30.0–36.0)
MCV: 93 fL (ref 78.0–100.0)
Monocytes Absolute: 0.5 10*3/uL (ref 0.1–1.0)
Monocytes Relative: 7 %
NEUTROS PCT: 71 %
Neutro Abs: 5.5 10*3/uL (ref 1.7–7.7)
Platelets: 160 10*3/uL (ref 150–400)
RBC: 4.7 MIL/uL (ref 3.87–5.11)
RDW: 12.9 % (ref 11.5–15.5)
WBC: 7.8 10*3/uL (ref 4.0–10.5)

## 2017-01-25 LAB — COMPREHENSIVE METABOLIC PANEL
ALT: 15 U/L (ref 14–54)
AST: 21 U/L (ref 15–41)
Albumin: 4.4 g/dL (ref 3.5–5.0)
Alkaline Phosphatase: 80 U/L (ref 38–126)
Anion gap: 11 (ref 5–15)
BUN: 14 mg/dL (ref 6–20)
CHLORIDE: 101 mmol/L (ref 101–111)
CO2: 28 mmol/L (ref 22–32)
CREATININE: 0.68 mg/dL (ref 0.44–1.00)
Calcium: 10 mg/dL (ref 8.9–10.3)
GFR calc Af Amer: >60 mL/min (ref >60)
GFR calc non Af Amer: >60 mL/min (ref >60)
GLUCOSE: 94 mg/dL (ref 65–99)
Potassium: 3.2 mmol/L — ABNORMAL LOW (ref 3.5–5.1)
SODIUM: 140 mmol/L (ref 135–145)
Total Bilirubin: 1.1 mg/dL (ref 0.3–1.2)
Total Protein: 7.6 g/dL (ref 6.5–8.1)

## 2017-01-25 LAB — D-DIMER, QUANTITATIVE: D-Dimer, Quant: 0.74 ug/mL-FEU — ABNORMAL HIGH (ref 0.00–0.50)

## 2017-01-25 LAB — TROPONIN I: Troponin I: <0.03 ng/mL (ref <0.03)

## 2017-01-25 MED ORDER — IOPAMIDOL (ISOVUE-370) INJECTION 76%
100.0000 mL | Freq: Once | INTRAVENOUS | Status: AC | PRN
  Administered 2017-01-25: 100 mL via INTRAVENOUS

## 2017-01-25 NOTE — ED Notes (Signed)
Pts oxygen saturations dropped down to low 80s while laying on stretcher. Oxygen applied to patient.

## 2017-01-25 NOTE — ED Notes (Signed)
MD at bedside discussing results with patient 

## 2017-01-25 NOTE — Discharge Instructions (Signed)
CT scan showed no evidence of any of blood clots in the lungs. Cardiac workup today negative. Make an appointment to follow-up with your primary care doctor. If symptoms persist he may consider referral to cardiology. Continue a baby aspirin a day. CT scan showed some evidence of some pulmonary nodules which will require close follow-up. This is normal. Usually recommend a CT scan repeat in about 3 months. Call your primary care doctor for this.

## 2017-01-25 NOTE — ED Provider Notes (Addendum)
[facility] DEPT [facility] Provider Note   CSN: [id number] Arrival date & time: 01/25/17  1301     History   Chief Complaint Chief Complaint  Patient presents with  . Chest Pain    HPI [name] [name] is a 76 y.o. female.  Patient with a complaint of intermittent left anterior chest pain started yesterday and present radiates to the back at the left scapular area. Pain lasts for less than 1 minute. Not made worse by any movement of the arms or taking a deep breath. No shortness of breath no nausea vomiting no diaphoresis no history of leg swelling. pain is occurred several times      Past Medical History:  Diagnosis Date  . Arthritis   . Chronic knee pain   . Diverticulosis 2008  . GERD (gastroesophageal reflux disease)   . History of stress test 10/2010   which she execised a 7 met workload achieving a peak heart rate of 141, representing 93% of predicted maximum. Scintigraphic images revealed ormal perfusion.  [name] Hx of echocardiogram 10/2010   which revealed normal systolic as well as diastolic function. she did have mild mitral annular calcification with mild MR and TR. She had mild aortic sclerosis without stenosis.  . Hyperlipidemia   . Hypertension   . Kidney stones   . Kidney stones   . Obesity   . Vitamin D deficiency     Patient Active Problem List   Diagnosis Date Noted  . Essential hypertension 07/10/2015  . Mild aortic insufficiency 07/08/2014  . Mild mitral regurgitation 07/08/2014  . GERD (gastroesophageal reflux disease) 07/08/2014  . Hyperlipidemia 05/11/2013  . HTN (hypertension) 05/11/2013  . Aortic valve sclerosis 05/11/2013  . Chest pain 10/09/2011  . Hypokalemia 10/09/2011  . Thrombocytopenia ([place]) 10/09/2011    Past Surgical History:  Procedure Laterality Date  . CHONDROPLASTY  08/19/2016   Procedure: CHONDROPLASTY patellar femoral joint, medial femoral condyle;  Surgeon: Dorna Leitz, MD;  Location: [facility];  Service:  Orthopedics;;  . KIDNEY STONE SURGERY    . KNEE ARTHROSCOPY WITH EXCISION PLICA Left 1/0/2725   Procedure: KNEE ARTHROSCOPY WITH EXCISION PLICA;  Surgeon: Dorna Leitz, MD;  Location: [facility];  Service: Orthopedics;  Laterality: Left;  . KNEE ARTHROSCOPY WITH MEDIAL MENISECTOMY Left 08/19/2016   Procedure: KNEE ARTHROSCOPY WITH MEDIAL MENISECTOMY;  Surgeon: Dorna Leitz, MD;  Location: [facility];  Service: Orthopedics;  Laterality: Left;  . Orthroscopic Rt Knee      OB History    No data available       Home Medications    Prior to Admission medications   Medication Sig Start Date End Date Taking? Authorizing Provider  amLODipine (NORVASC) 5 MG tablet Take 5 mg by mouth daily.    [provider]  aspirin EC 81 MG tablet Take 81 mg by mouth daily.    [provider]  Cholecalciferol (VITAMIN D3) 2000 UNITS capsule Take 2,000 Units by mouth daily.    [provider]  fish oil-omega-3 fatty acids 1000 MG capsule Take 2 g by mouth daily.    [provider]  hydrochlorothiazide (HYDRODIURIL) 25 MG tablet Take 12.5 mg by mouth daily.     [provider]  oxyCODONE-acetaminophen (PERCOCET/ROXICET) 5-325 MG tablet Take 1-2 tablets by mouth every 6 (six) hours as needed for severe pain. 08/19/16   Gary Fleet, PA-C  simvastatin (ZOCOR) 20 MG tablet Take 1 tablet (20 mg total) by mouth at bedtime.  10/24/13   Troy Sine, MD    Family History Family History  Problem Relation Age of Onset  . Heart disease Mother   . Dementia Mother   . Lung cancer Father   . Stroke Maternal Grandmother   . Heart disease Maternal Grandmother   . Colon cancer Neg Hx   . Esophageal cancer Neg Hx   . Rectal cancer Neg Hx   . Stomach cancer Neg Hx     Social History Social History  Substance Use Topics  . Smoking status: Never Smoker  . Smokeless tobacco: Never Used  . Alcohol use Yes     Comment: wine occ     Allergies    Patient has no known allergies.   Review of Systems Review of Systems  Constitutional: Negative for diaphoresis and fever.  HENT: Negative for congestion.   Eyes: Negative for visual disturbance.  Respiratory: Negative for shortness of breath.   Cardiovascular: Positive for chest pain.  Gastrointestinal: Negative for abdominal pain, nausea and vomiting.  Genitourinary: Negative for dysuria.  Musculoskeletal: Positive for back pain.  Skin: Negative for rash.  Neurological: Negative for headaches.  Hematological: Does not bruise/bleed easily.  Psychiatric/Behavioral: Negative for confusion.     Physical Exam Updated Vital Signs BP (!) 141/67   Pulse 78   Temp 99.7 F (37.6 C)   Resp 20   Ht 1.651 m (_0 )   Wt 75.8 kg (167 lb)   SpO2 94%   BMI 27.79 kg/m   Physical Exam  Constitutional: She is oriented to person, place, and time. She appears well-developed and well-nourished. No distress.  HENT:  Head: Normocephalic and atraumatic.  Mouth/Throat: Oropharynx is clear and moist.  Eyes: Conjunctivae and EOM are normal. Pupils are equal, round, and reactive to light.  Neck: Normal range of motion. Neck supple.  Cardiovascular: Normal rate and regular rhythm.   Pulmonary/Chest: Effort normal and breath sounds normal.  Abdominal: Soft. Bowel sounds are normal. There is no tenderness.  Musculoskeletal: Normal range of motion. She exhibits no edema.  Neurological: She is alert and oriented to person, place, and time. No cranial nerve deficit or sensory deficit. She exhibits normal muscle tone. Coordination normal.  Skin: Skin is warm.  Nursing note and vitals reviewed.    ED Treatments / Results  Labs (all labs ordered are listed, but only abnormal results are displayed) Labs Reviewed  CBC WITH DIFFERENTIAL/PLATELET - Abnormal; Notable for the following:       Result Value   Hemoglobin 15.3 (*)    All other components within normal limits  COMPREHENSIVE METABOLIC  PANEL - Abnormal; Notable for the following:    Potassium 3.2 (*)    All other components within normal limits  D-DIMER, QUANTITATIVE (NOT AT [facility]) - Abnormal; Notable for the following:    D-Dimer, Quant 0.74 (*)    All other components within normal limits  TROPONIN I    EKG  EKG Interpretation  Date/Time:  Wednesday January 25 2017 13:14:32 EDT Ventricular Rate:  85 PR Interval:    QRS Duration: 90 QT Interval:  381 QTC Calculation: 453 R Axis:   43 Text Interpretation:  Sinus rhythm Borderline T abnormalities, anterior leads Confirmed by Fredia Sorrow [id number]) on 01/25/2017 1:36:41 PM       Radiology Dg Chest 2 View  Result Date: 01/25/2017 CLINICAL DATA:  Chest pain. EXAM: CHEST  2 VIEW COMPARISON:  Radiographs of February 07, 2012. FINDINGS: The heart size and mediastinal contours are  within normal limits. No pneumothorax or pleural effusion is noted. Right lung is clear. Stable scarring is seen in lingular segment of left lung. No acute pulmonary disease is noted. The visualized skeletal structures are unremarkable. IMPRESSION: No active cardiopulmonary disease. Electronically Signed   By: Marijo Conception, M.D.   On: 01/25/2017 13:37   Ct Angio Chest Pe W/cm &/or Wo Cm  Result Date: 01/25/2017 CLINICAL DATA:  76 year old female with left chest pain radiating to the back and arms since last night. EXAM: CT ANGIOGRAPHY CHEST WITH CONTRAST TECHNIQUE: Multidetector CT imaging of the chest was performed using the standard protocol during bolus administration of intravenous contrast. Multiplanar CT image reconstructions and MIPs were obtained to evaluate the vascular anatomy. CONTRAST:  100 mL Isovue 370 COMPARISON:  Chest radiographs 1340 hours today and earlier. CT Abdomen and Pelvis 11/05/2012 FINDINGS: Cardiovascular: Good contrast bolus timing in the pulmonary arterial tree. No focal filling defect identified in the pulmonary arteries to suggest acute pulmonary embolism. Mild  cardiomegaly. Calcified coronary artery atherosclerosis. No pericardial effusion. Comparatively minimal thoracic and upper abdominal aortic atherosclerosis. Mediastinum/Nodes: Negative. No mediastinal or hilar lymphadenopathy. Lungs/Pleura: Small volume retained secretions along the left wall of the trachea. Otherwise the major airways are patent. Atelectasis in the lingula and medial segment of the right middle lobe. Superimposed clustered right lung nodules about the right inferior hilum. At least 5 peribronchial nodules are identified there ranging from 2-3 mm to 7 mm diameter. There is mild pleural thickening and architectural distortion in this region. There is a small 3-4 mm nodule in the central right upper lobe which is solitary (series 5, image 37). There are occasional subtle 2 mm subpleural and peribronchial nodules in the right lower lobe. The dominant left lung nodule is an elongated greater than 14 mm oval/tapered nodule in the superior segment of the left lower lobe with simple fluid density (series 5, image 41 and series 9, image 123). There are occasional 5 mm or smaller nodules also in the left lung (series 5, image 50). Upper Abdomen: Negative visualized liver, spleen, pancreas, adrenal glands, kidneys, and bowel in the upper abdomen. Musculoskeletal: Degenerative appearing endplate sclerosis in the thoracic spine. No acute or suspicious osseous lesion identified. Review of the MIP images confirms the above findings. IMPRESSION: 1.  No evidence of acute pulmonary embolus. 2. Follow-up chest CT with IV contrast recommended in 3 months to re-evaluate lung findings including: - 14 mm superior segment left lower lobe elongated and low-density nodule which is favored to be a dilated mucous impacted airway, - right inferior hilum subpleural nodules in an area of chronic scarring which are favored to be postinflammatory. - occasional other sub 5 mm pulmonary nodules. 3. Calcified coronary artery  atherosclerosis. Comparatively mild aortic atherosclerosis. Electronically Signed   By: Genevie Ann M.D.   On: 01/25/2017 15:08    Procedures Procedures (including critical care time)  Medications Ordered in ED Medications  iopamidol (ISOVUE-370) 76 % injection 100 mL (100 mLs Intravenous Contrast Given 01/25/17 1442)     Initial Impression / Assessment and Plan / ED Course  I have reviewed the triage vital signs and the nursing notes.  Pertinent labs & imaging results that were available during my care of the patient were reviewed by me and considered in my medical decision making (see chart for details).     Chest pains very brief. Not lasting long enough to be consistent with an acute cardiac event. Also unlikely to be related to  unstable angina. EKG without acute findings first troponin negative. However d-dimer was slightly elevated. So patient was CT angiogram to rule out pulmonary embolus or any other issues. Clinically, I doubt that it's a dissection. But CT will help sort that out. No abdominal pain. Patient without pain currently.  CT angios without evidence of pulmonary embolus. Does have findings of pulmonary nodules which will require close follow-up with her primary care doctor.  Final Clinical Impressions(s) / ED Diagnoses   Final diagnoses:  Precordial pain  Pulmonary nodules    New Prescriptions New Prescriptions   No medications on file     Fredia Sorrow, MD 01/25/17 1430    Fredia Sorrow, MD 01/25/17 [phone]

## 2017-01-25 NOTE — Telephone Encounter (Signed)
Patient called in with chest pain and discomfort. She stated that she has had it since last night, intermittently. She has not taken anything for pain. She has tried Tums and Prilosec without any relief. The pain radiates from the heart to her shoulder and back. She denied shortness of breath. She asked whether she should go to the Emergency Room. It was advised that she should go for further assessment. She stated that she would have someone [facility].

## 2017-01-25 NOTE — ED Notes (Signed)
Pt returned from CT at this time.  

## 2017-01-25 NOTE — ED Notes (Signed)
Patient transported to CT 

## 2017-01-25 NOTE — ED Triage Notes (Signed)
Pt c/o left sided chest pain " on and off" x 2 days denies SOB or n/v

## 2017-02-02 ENCOUNTER — Ambulatory Visit
Admission: RE | Admit: 2017-02-02 | Discharge: 2017-02-02 | Disposition: A | Discharge status: Home / Self Care | Payer: Medicare Other | Source: Ambulatory Visit | Attending: Family Medicine | Admitting: Family Medicine

## 2017-02-02 DIAGNOSIS — Z1231 Encounter for screening mammogram for malignant neoplasm of breast: Secondary | ICD-10-CM

## 2017-02-07 ENCOUNTER — Ambulatory Visit (HOSPITAL_COMMUNITY): Payer: Medicare Other | Attending: Cardiology

## 2017-02-07 ENCOUNTER — Other Ambulatory Visit: Payer: Self-pay

## 2017-02-07 DIAGNOSIS — I340 Nonrheumatic mitral (valve) insufficiency: Secondary | ICD-10-CM | POA: Diagnosis not present

## 2017-02-07 DIAGNOSIS — I10 Essential (primary) hypertension: Secondary | ICD-10-CM | POA: Diagnosis not present

## 2017-02-07 DIAGNOSIS — E785 Hyperlipidemia, unspecified: Secondary | ICD-10-CM | POA: Insufficient documentation

## 2017-02-07 DIAGNOSIS — I358 Other nonrheumatic aortic valve disorders: Secondary | ICD-10-CM | POA: Diagnosis not present

## 2017-02-07 DIAGNOSIS — I348 Other nonrheumatic mitral valve disorders: Secondary | ICD-10-CM | POA: Diagnosis not present

## 2017-02-07 DIAGNOSIS — I351 Nonrheumatic aortic (valve) insufficiency: Secondary | ICD-10-CM | POA: Diagnosis not present

## 2017-04-24 ENCOUNTER — Other Ambulatory Visit (HOSPITAL_BASED_OUTPATIENT_CLINIC_OR_DEPARTMENT_OTHER): Payer: Self-pay | Admitting: Family Medicine

## 2017-04-24 DIAGNOSIS — R918 Other nonspecific abnormal finding of lung field: Secondary | ICD-10-CM

## 2017-05-02 ENCOUNTER — Encounter (HOSPITAL_BASED_OUTPATIENT_CLINIC_OR_DEPARTMENT_OTHER): Payer: Self-pay

## 2017-05-02 ENCOUNTER — Ambulatory Visit (HOSPITAL_BASED_OUTPATIENT_CLINIC_OR_DEPARTMENT_OTHER)
Admission: RE | Admit: 2017-05-02 | Discharge: 2017-05-02 | Disposition: A | Discharge status: Home / Self Care | Payer: Medicare Other | Source: Ambulatory Visit | Attending: Family Medicine | Admitting: Family Medicine

## 2017-05-02 DIAGNOSIS — R918 Other nonspecific abnormal finding of lung field: Secondary | ICD-10-CM | POA: Insufficient documentation

## 2017-05-02 DIAGNOSIS — I700 Atherosclerosis of aorta: Secondary | ICD-10-CM | POA: Insufficient documentation

## 2017-05-02 DIAGNOSIS — I2510 Atherosclerotic heart disease of native coronary artery without angina pectoris: Secondary | ICD-10-CM | POA: Diagnosis not present

## 2017-05-02 MED ORDER — IOPAMIDOL (ISOVUE-300) INJECTION 61%
100.0000 mL | Freq: Once | INTRAVENOUS | Status: AC | PRN
  Administered 2017-05-02: 100 mL via INTRAVENOUS

## 2017-09-11 ENCOUNTER — Encounter (HOSPITAL_BASED_OUTPATIENT_CLINIC_OR_DEPARTMENT_OTHER): Payer: Self-pay | Admitting: *Deleted

## 2017-09-11 ENCOUNTER — Emergency Department (HOSPITAL_BASED_OUTPATIENT_CLINIC_OR_DEPARTMENT_OTHER)
Admission: EM | Admit: 2017-09-11 | Discharge: 2017-09-12 | Disposition: A | Discharge status: Home / Self Care | Payer: Medicare Other | Attending: Emergency Medicine | Admitting: Emergency Medicine

## 2017-09-11 DIAGNOSIS — Z7982 Long term (current) use of aspirin: Secondary | ICD-10-CM | POA: Diagnosis not present

## 2017-09-11 DIAGNOSIS — M79601 Pain in right arm: Secondary | ICD-10-CM

## 2017-09-11 DIAGNOSIS — M79621 Pain in right upper arm: Secondary | ICD-10-CM | POA: Diagnosis not present

## 2017-09-11 DIAGNOSIS — Z79899 Other long term (current) drug therapy: Secondary | ICD-10-CM | POA: Insufficient documentation

## 2017-09-11 DIAGNOSIS — I10 Essential (primary) hypertension: Secondary | ICD-10-CM | POA: Diagnosis not present

## 2017-09-11 NOTE — ED Triage Notes (Signed)
C/o rt upper arm pain x 3 days radiating to shoulder,  Denies inj  No change  In pain w movement

## 2017-09-12 NOTE — ED Notes (Signed)
Pt on monitor 

## 2017-09-12 NOTE — ED Provider Notes (Signed)
[facility] EMERGENCY DEPARTMENT Provider Note   CSN: [id number] Arrival date & time: 09/11/17  2257     History   Chief Complaint Chief Complaint  Patient presents with  . Extremity Pain    HPI [name] [name] is a 77 y.o. female.  The history is provided by the patient.  Extremity Pain  This is a new problem. The current episode started more than 2 days ago. The problem occurs constantly. The problem has been gradually worsening. Pertinent negatives include no chest pain and no shortness of breath. The symptoms are aggravated by twisting. The symptoms are relieved by rest.   She reports gradual onset of right upper arm pain about 3 days ago. No Trauma, pain seems worse with movement of her arm Will radiate back into her shoulder and scapula Denies chest pain/shortness of breath No focal weakness to her arms.  No discoloration of her hands. Past Medical History:  Diagnosis Date  . Arthritis   . Chronic knee pain   . Diverticulosis 2008  . GERD (gastroesophageal reflux disease)   . History of stress test 10/2010   which she execised a 7 met workload achieving a peak heart rate of 141, representing 93% of predicted maximum. Scintigraphic images revealed ormal perfusion.  [name] Hx of echocardiogram 10/2010   which revealed normal systolic as well as diastolic function. she did have mild mitral annular calcification with mild MR and TR. She had mild aortic sclerosis without stenosis.  . Hyperlipidemia   . Hypertension   . Kidney stones   . Kidney stones   . Obesity   . Vitamin D deficiency     Patient Active Problem List   Diagnosis Date Noted  . Essential hypertension 07/10/2015  . Mild aortic insufficiency 07/08/2014  . Mild mitral regurgitation 07/08/2014  . GERD (gastroesophageal reflux disease) 07/08/2014  . Hyperlipidemia 05/11/2013  . HTN (hypertension) 05/11/2013  . Aortic valve sclerosis 05/11/2013  . Chest pain 10/09/2011  . Hypokalemia 10/09/2011    . Thrombocytopenia ([place]) 10/09/2011    Past Surgical History:  Procedure Laterality Date  . CHONDROPLASTY  08/19/2016   Procedure: CHONDROPLASTY patellar femoral joint, medial femoral condyle;  Surgeon: Dorna Leitz, MD;  Location: [facility];  Service: Orthopedics;;  . KIDNEY STONE SURGERY    . KNEE ARTHROSCOPY WITH EXCISION PLICA Left 11/17/8097   Procedure: KNEE ARTHROSCOPY WITH EXCISION PLICA;  Surgeon: Dorna Leitz, MD;  Location: [facility];  Service: Orthopedics;  Laterality: Left;  . KNEE ARTHROSCOPY WITH MEDIAL MENISECTOMY Left 08/19/2016   Procedure: KNEE ARTHROSCOPY WITH MEDIAL MENISECTOMY;  Surgeon: Dorna Leitz, MD;  Location: [facility];  Service: Orthopedics;  Laterality: Left;  . Orthroscopic Rt Knee      OB History    No data available       Home Medications    Prior to Admission medications   Medication Sig Start Date End Date Taking? Authorizing Provider  amLODipine (NORVASC) 5 MG tablet Take 5 mg by mouth daily.    [provider]  aspirin EC 81 MG tablet Take 81 mg by mouth daily.    [provider]  Cholecalciferol (VITAMIN D3) 2000 UNITS capsule Take 2,000 Units by mouth daily.    [provider]  fish oil-omega-3 fatty acids 1000 MG capsule Take 2 g by mouth daily.    [provider]  hydrochlorothiazide (HYDRODIURIL) 25 MG tablet Take 12.5 mg by mouth daily.     [provider]  oxyCODONE-acetaminophen (PERCOCET/ROXICET) 5-325 MG tablet Take 1-2 tablets by mouth every 6 (six) hours as needed for severe pain. 08/19/16   Gary Fleet, PA-C  simvastatin (ZOCOR) 20 MG tablet Take 1 tablet (20 mg total) by mouth at bedtime. 10/24/13   Troy Sine, MD    Family History Family History  Problem Relation Age of Onset  . Heart disease Mother   . Dementia Mother   . Lung cancer Father   . Stroke Maternal Grandmother   . Heart disease Maternal Grandmother   . Colon cancer Neg  Hx   . Esophageal cancer Neg Hx   . Rectal cancer Neg Hx   . Stomach cancer Neg Hx     Social History Social History   Tobacco Use  . Smoking status: Never Smoker  . Smokeless tobacco: Never Used  Substance Use Topics  . Alcohol use: Yes    Comment: wine occ  . Drug use: No     Allergies   Patient has no known allergies.   Review of Systems Review of Systems  Constitutional: Negative for fever.  Respiratory: Negative for shortness of breath.   Cardiovascular: Negative for chest pain.  Musculoskeletal: Positive for arthralgias.  Skin: Negative for color change.  Neurological: Negative for weakness and numbness.  All other systems reviewed and are negative.    Physical Exam Updated Vital Signs BP 134/82 (BP Location: Right Arm)   Pulse 79   Temp 98.3 F (36.8 C) (Oral)   Resp 18   SpO2 95%   Physical Exam CONSTITUTIONAL: Well developed/well nourished HEAD: Normocephalic/atraumatic EYES: EOMI/PERRL ENMT: Mucous membranes moist NECK: supple no meningeal signs SPINE/BACK:entire spine nontender CV: S1/S2 noted, no murmurs/rubs/gallops noted LUNGS: Lungs are clear to auscultation bilaterally, no apparent distress ABDOMEN: soft, nontender NEURO: Pt is awake/alert/appropriate, moves all extremitiesx4.  No facial droop.   EXTREMITIES: pulses normal/equal, full ROM, mild tenderness to right upper arm, no swelling, no edema, no erythema, arms are symmetric.  Distal pulses are equal and intact.  Equal handgrips are noted. Pain is reproduced when patient abducts and lifts her right arm SKIN: warm, color normal PSYCH: no abnormalities of mood noted, alert and oriented to situation   ED Treatments / Results  Labs (all labs ordered are listed, but only abnormal results are displayed) Labs Reviewed - No data to display  EKG  EKG Interpretation  Date/Time:  Tuesday September 12 2017 01:48:34 EST Ventricular Rate:  78 PR Interval:    QRS Duration: 89 QT  Interval:  407 QTC Calculation: 464 R Axis:   100 Text Interpretation:  Sinus rhythm Atrial premature complex Right axis deviation Low voltage, precordial leads Borderline T abnormalities, anterior leads Interpretation limited secondary to artifact Confirmed by Ripley Fraise [phone]) on 09/12/2017 1:53:35 AM       Radiology No results found.  Procedures Procedures (including critical care time)  Medications Ordered in ED Medications - No data to display   Initial Impression / Assessment and Plan / ED Course  I have reviewed the triage vital signs and the nursing notes.      Imaging not indicated at this time.  Patient was mostly concerned that this could represent something to do with her heart.  EKG performed in no acute changes. Suspect soft tissue or musculoskeletal issue.  No signs of bony trauma.  Will discharge home with PCP follow-up  Final Clinical Impressions(s) / ED Diagnoses   Final diagnoses:  Right arm pain    ED Discharge  Orders    None       Ripley Fraise, MD 09/12/17 [phone]

## 2018-01-16 ENCOUNTER — Other Ambulatory Visit: Payer: Self-pay | Admitting: Family Medicine

## 2018-01-16 DIAGNOSIS — Z1231 Encounter for screening mammogram for malignant neoplasm of breast: Secondary | ICD-10-CM

## 2018-01-26 SURGERY — Surgical Case
Anesthesia: *Unknown

## 2018-02-26 ENCOUNTER — Ambulatory Visit: Payer: Medicare Other

## 2018-03-19 ENCOUNTER — Ambulatory Visit
Admission: RE | Admit: 2018-03-19 | Discharge: 2018-03-19 | Disposition: A | Discharge status: Home / Self Care | Payer: Medicare Other | Source: Ambulatory Visit | Attending: Family Medicine | Admitting: Family Medicine

## 2018-03-19 DIAGNOSIS — Z1231 Encounter for screening mammogram for malignant neoplasm of breast: Secondary | ICD-10-CM

## 2018-03-20 ENCOUNTER — Encounter: Payer: Self-pay | Admitting: Physician Assistant

## 2018-03-20 ENCOUNTER — Ambulatory Visit: Payer: Medicare Other | Admitting: Physician Assistant

## 2018-03-20 VITALS — BP 124/64 | HR 94 | Ht 65.0 in | Wt 168.0 lb

## 2018-03-20 DIAGNOSIS — I491 Atrial premature depolarization: Secondary | ICD-10-CM

## 2018-03-20 DIAGNOSIS — R5383 Other fatigue: Secondary | ICD-10-CM

## 2018-03-20 DIAGNOSIS — E785 Hyperlipidemia, unspecified: Secondary | ICD-10-CM

## 2018-03-20 DIAGNOSIS — I351 Nonrheumatic aortic (valve) insufficiency: Secondary | ICD-10-CM | POA: Diagnosis not present

## 2018-03-20 DIAGNOSIS — I10 Essential (primary) hypertension: Secondary | ICD-10-CM | POA: Diagnosis not present

## 2018-03-20 DIAGNOSIS — I2510 Atherosclerotic heart disease of native coronary artery without angina pectoris: Secondary | ICD-10-CM

## 2018-03-20 MED ORDER — METOPROLOL TARTRATE 25 MG PO TABS: 25.0000 mg | ORAL_TABLET | Freq: Two times a day (BID) | ORAL | 6 refills | Status: DC

## 2018-03-20 NOTE — Patient Instructions (Signed)
Medication Instructions: Your physician recommends that you continue on your current medications as directed. Please refer to the Current Medication list given to you today.  STOP Amlodipine  START Metoprolol Tartrate 25 mg twice daily.   Labwork: Your physician recommends that you return for lab work in: [facility] today to check thyroid function.   Follow-Up: Your physician recommends that you schedule a follow-up appointment in: 3-4 weeks with Azalee CourseHao Meng, PA  If you need a refill on your cardiac medications before your next appointment, please call your pharmacy.

## 2018-03-20 NOTE — Progress Notes (Signed)
Cardiology Office Note    Date:  03/20/2018   ID:  [name] [name], DOB 1941/02/20, MRN [id number]  PCP:  Orpah Melter, MD  Cardiologist:  Dr. Claiborne Billings   Chief Complaint  Patient presents with  . Follow-up    seen for Dr. Claiborne Billings, palpitation x 3 since yesterday.    History of Present Illness:  [name] [name] is a 77 y.o. female with PMH of diverticulosis, GERD, hypertension, hyperlipidemia, and history of vitamin D deficiency.  Previous Myoview obtained on 11/01/2010 showed EF 73%, normal perfusion.  Echocardiogram obtained on 02/07/2017 showed EF 60 to 65%, mild AI.  She was evaluated in the ED on 01/25/2017 for chest pain.  CT angiogram of the chest showed no evidence of PE, however multiple lung nodules were noted.  This was followed up by a repeat CT of the chest with contrast in September 2018, to small lung nodules were stable and appears to be unchanged, LAD in the left circumflex coronary artery calcification was also noted.  Patient presents today for cardiology office visit.  She denies any exertional chest pain or shortness of breath.  However since yesterday, she started having occasional palpitation.  This does not seem to have clear correlation with exertion.  She denies any associated symptoms such as dizziness, blurred vision or feeling of passing out.  She says her palpitation seems to self resolve within a few seconds.  She had 2 episodes of palpitation yesterday and one more episode this morning.  Today's EKG strip does show that she has some frequent PACs.  I have discontinued her amlodipine and the switched her to metoprolol tartrate 25 mg twice daily.  I will bring her back in 3 to 4 weeks for reassessment.  Otherwise she has no lower extremity edema, orthopnea or PND.  Past Medical History:  Diagnosis Date  . Arthritis   . Chronic knee pain   . Diverticulosis 2008  . GERD (gastroesophageal reflux disease)   . History of stress test 10/2010   which she execised a 7 met  workload achieving a peak heart rate of 141, representing 93% of predicted maximum. Scintigraphic images revealed ormal perfusion.  [name] Hx of echocardiogram 10/2010   which revealed normal systolic as well as diastolic function. she did have mild mitral annular calcification with mild MR and TR. She had mild aortic sclerosis without stenosis.  . Hyperlipidemia   . Hypertension   . Kidney stones   . Kidney stones   . Obesity   . Vitamin D deficiency     Past Surgical History:  Procedure Laterality Date  . CHONDROPLASTY  08/19/2016   Procedure: CHONDROPLASTY patellar femoral joint, medial femoral condyle;  Surgeon: Dorna Leitz, MD;  Location: [place];  Service: Orthopedics;;  . KIDNEY STONE SURGERY    . KNEE ARTHROSCOPY WITH EXCISION PLICA Left 10/20/8586   Procedure: KNEE ARTHROSCOPY WITH EXCISION PLICA;  Surgeon: Dorna Leitz, MD;  Location: [facility];  Service: Orthopedics;  Laterality: Left;  . KNEE ARTHROSCOPY WITH MEDIAL MENISECTOMY Left 08/19/2016   Procedure: KNEE ARTHROSCOPY WITH MEDIAL MENISECTOMY;  Surgeon: Dorna Leitz, MD;  Location: [place];  Service: Orthopedics;  Laterality: Left;  . Orthroscopic Rt Knee      Current Medications: Outpatient Medications Prior to Visit  Medication Sig Dispense Refill  . aspirin EC 81 MG tablet Take 81 mg by mouth daily.    . Cholecalciferol (VITAMIN D3) 2000 UNITS capsule Take 2,000 Units by mouth  daily.    . hydrochlorothiazide (HYDRODIURIL) 25 MG tablet Take 12.5 mg by mouth daily.     . simvastatin (ZOCOR) 20 MG tablet Take 1 tablet (20 mg total) by mouth at bedtime. 30 tablet 6  . amLODipine (NORVASC) 5 MG tablet Take 5 mg by mouth daily.    . fish oil-omega-3 fatty acids 1000 MG capsule Take 2 g by mouth daily.    [name] oxyCODONE-acetaminophen (PERCOCET/ROXICET) 5-325 MG tablet Take 1-2 tablets by mouth every 6 (six) hours as needed for severe pain. 40 tablet 0   No facility-administered  medications prior to visit.      Allergies:   Patient has no known allergies.   Social History   Socioeconomic History  . Marital status: Widowed    Spouse name: Not on file  . Number of children: 2  . Years of education: Not on file  . Highest education level: Not on file  Occupational History  . Occupation: Retired  Scientific laboratory technician  . Financial resource strain: Not on file  . Food insecurity:    Worry: Not on file    Inability: Not on file  . Transportation needs:    Medical: Not on file    Non-medical: Not on file  Tobacco Use  . Smoking status: Never Smoker  . Smokeless tobacco: Never Used  Substance and Sexual Activity  . Alcohol use: Yes    Comment: wine occ  . Drug use: No  . Sexual activity: Not on file  Lifestyle  . Physical activity:    Days per week: Not on file    Minutes per session: Not on file  . Stress: Not on file  Relationships  . Social connections:    Talks on phone: Not on file    Gets together: Not on file    Attends religious service: Not on file    Active member of club or organization: Not on file    Attends meetings of clubs or organizations: Not on file    Relationship status: Not on file  Other Topics Concern  . Not on file  Social History Narrative  . Not on file     Family History:  The patient's family history includes Dementia in her mother; Heart disease in her maternal grandmother and mother; Lung cancer in her father; Stroke in her maternal grandmother.   ROS:   Please see the history of present illness.    ROS All other systems reviewed and are negative.   PHYSICAL EXAM:   VS:  BP 124/64   Pulse 94   Ht 5' 5"  (1.651 m)   Wt 168 lb (76.2 kg)   BMI 27.96 kg/m    GEN: Well nourished, well developed, in no acute distress  HEENT: normal  Neck: no JVD, carotid bruits, or masses Cardiac: RRR; no rubs, or gallops,no edema  2/6 murmur Respiratory:  clear to auscultation bilaterally, normal work of breathing GI: soft,  nontender, nondistended, + BS MS: no deformity or atrophy  Skin: warm and dry, no rash Neuro:  Alert and Oriented x 3, Strength and sensation are intact Psych: euthymic mood, full affect  Wt Readings from Last 3 Encounters:  03/20/18 168 lb (76.2 kg)  01/25/17 167 lb (75.8 kg)  08/19/16 174 lb (78.9 kg)      Studies/Labs Reviewed:   EKG:  EKG is ordered today.  The ekg ordered today demonstrates normal sinus rhythm with PACs  Recent Labs: No results found for requested labs within last  8760 hours.   Lipid Panel    Component Value Date/Time   CHOL 145 11/29/2013 1034   CHOL 181 05/20/2013 0909   TRIG 77 11/29/2013 1034   TRIG 86 05/20/2013 0909   HDL 52 11/29/2013 1034   HDL 49 05/20/2013 0909   CHOLHDL 2.8 11/29/2013 1034   VLDL 15 11/29/2013 1034   LDLCALC 78 11/29/2013 1034   LDLCALC 115 (H) 05/20/2013 0909    Additional studies/ records that were reviewed today include:   Echo 02/07/2017 LV EF: 60% -   65% Study Conclusions  - Left ventricle: The cavity size was normal. Systolic function was   normal. The estimated ejection fraction was in the range of 60%   to 65%. Wall motion was normal; there were no regional wall   motion abnormalities. Left ventricular diastolic function   parameters were normal. - Aortic valve: There was mild regurgitation. - Mitral valve: Calcified annulus. - Atrial septum: No defect or patent foramen ovale was identified.    CT w contrast 05/02/2017 IMPRESSION: 1. Stable CT of the chest. Small pulmonary nodules are unchanged measuring up to 7 mm. Future CT at 18-24 months (from today's scan) is considered optional for low-risk patients, but is recommended for high-risk patients. This recommendation follows the consensus statement: Guidelines for Management of Incidental Pulmonary Nodules Detected on CT Images: From the Fleischner Society 2017; Radiology 2017; 284:228-243. 2. Chronic postinflammatory changes are identified  affecting the right middle lobe and lingula. 3.  Aortic Atherosclerosis (ICD10-I70.0). 4. Lad and left circumflex coronary artery calcifications noted.  ASSESSMENT:    1. PAC (premature atrial contraction)   2. Atherosclerosis of native coronary artery of native heart without angina pectoris   3. Nonrheumatic aortic valve insufficiency   4. Essential hypertension   5. Hyperlipidemia, unspecified hyperlipidemia type      PLAN:  In order of problems listed above:  1. Palpitation: She had a total of 3 very transient palpitation episodes since yesterday.  There was no associated symptoms such as dizziness, blurred vision or feeling of passing out.  EKG showed PACs.  I will discontinue her amlodipine and switch her to metoprolol tartrate 25 mg twice daily.  I will also check a TSH to make sure there is no thyroid issue.  She does not drink coffee but does drink tea with occasional [facility].  I asked her to stop [facility].  2. Coronary calcification: Seen on the recent CT in 2018, however she denies any exertional chest pain or shortness of breath.  3. Aortic insufficiency: Seen on previous echocardiogram, appears to be mild based on physical exam.  Will defer to Dr. Claiborne Billings with regard to repeat echocardiogram on the next follow-up.  4. Hypertension: Blood pressure well controlled on current regimen, will discontinue amlodipine and add metoprolol tartrate 25 mg twice daily to help control palpitation.  5. Hyperlipidemia: Recent lab work obtained he April 2019 showed fairly controlled cholesterol based on KPN    Medication Adjustments/Labs and Tests Ordered: Current medicines are reviewed at length with the patient today.  Concerns regarding medicines are outlined above.  Medication changes, Labs and Tests ordered today are listed in the Patient Instructions below. Patient Instructions  Medication Instructions: Your physician recommends that you continue on your current medications  as directed. Please refer to the Current Medication list given to you today.  STOP Amlodipine  START Metoprolol Tartrate 25 mg twice daily.   Labwork: Your physician recommends that you return for lab work in:  TSH today to check thyroid function.   Follow-Up: Your physician recommends that you schedule a follow-up appointment in: 3-4 weeks with Almyra Deforest, PA  If you need a refill on your cardiac medications before your next appointment, please call your pharmacy.     Hilbert Corrigan, [place]  03/20/2018 10:18 AM    [address], [place], [place]  [address] Phone: [phone]; Fax: [phone]

## 2018-03-21 LAB — TSH: TSH: 1.56 u[IU]/mL (ref 0.450–4.500)

## 2018-03-21 NOTE — Progress Notes (Signed)
Thyroid level normal.

## 2018-03-26 ENCOUNTER — Encounter (HOSPITAL_BASED_OUTPATIENT_CLINIC_OR_DEPARTMENT_OTHER): Payer: Self-pay | Admitting: Emergency Medicine

## 2018-03-26 ENCOUNTER — Emergency Department (HOSPITAL_BASED_OUTPATIENT_CLINIC_OR_DEPARTMENT_OTHER)
Admission: EM | Admit: 2018-03-26 | Discharge: 2018-03-26 | Disposition: A | Discharge status: Home / Self Care | Payer: Medicare Other | Attending: Emergency Medicine | Admitting: Emergency Medicine

## 2018-03-26 ENCOUNTER — Other Ambulatory Visit: Payer: Self-pay

## 2018-03-26 DIAGNOSIS — Z79899 Other long term (current) drug therapy: Secondary | ICD-10-CM | POA: Insufficient documentation

## 2018-03-26 DIAGNOSIS — Z7982 Long term (current) use of aspirin: Secondary | ICD-10-CM | POA: Insufficient documentation

## 2018-03-26 DIAGNOSIS — Z8679 Personal history of other diseases of the circulatory system: Secondary | ICD-10-CM | POA: Diagnosis not present

## 2018-03-26 DIAGNOSIS — I10 Essential (primary) hypertension: Secondary | ICD-10-CM | POA: Insufficient documentation

## 2018-03-26 DIAGNOSIS — R002 Palpitations: Secondary | ICD-10-CM | POA: Insufficient documentation

## 2018-03-26 LAB — BASIC METABOLIC PANEL
ANION GAP: 11 (ref 5–15)
BUN: 16 mg/dL (ref 8–23)
CO2: 27 mmol/L (ref 22–32)
Calcium: 9.3 mg/dL (ref 8.9–10.3)
Chloride: 101 mmol/L (ref 98–111)
Creatinine, Ser: 0.74 mg/dL (ref 0.44–1.00)
Glucose, Bld: 103 mg/dL — ABNORMAL HIGH (ref 70–99)
POTASSIUM: 3.9 mmol/L (ref 3.5–5.1)
SODIUM: 139 mmol/L (ref 135–145)

## 2018-03-26 LAB — CBC
HEMATOCRIT: 43.1 % (ref 36.0–46.0)
HEMOGLOBIN: 14.2 g/dL (ref 12.0–15.0)
MCH: 31.2 pg (ref 26.0–34.0)
MCHC: 32.9 g/dL (ref 30.0–36.0)
MCV: 94.7 fL (ref 78.0–100.0)
Platelets: 225 10*3/uL (ref 150–400)
RBC: 4.55 MIL/uL (ref 3.87–5.11)
RDW: 13.1 % (ref 11.5–15.5)
WBC: 9.3 10*3/uL (ref 4.0–10.5)

## 2018-03-26 LAB — TROPONIN I: Troponin I: <0.03 ng/mL (ref <0.03)

## 2018-03-26 LAB — MAGNESIUM: MAGNESIUM: 2.1 mg/dL (ref 1.7–2.4)

## 2018-03-26 NOTE — ED Notes (Signed)
Pt reports increased anxiety due to financial concerns, pt paying out of pocket for physical therapy and reports she is constantly worrying. Pt denies chest pain, SOB, dizziness.

## 2018-03-26 NOTE — ED Provider Notes (Signed)
[facility] EMERGENCY DEPARTMENT Provider Note   CSN: [id number] Arrival date & time: 03/26/18  2139     History   Chief Complaint Chief Complaint  Patient presents with  . Palpitations    HPI [name] [name] is a 77 y.o. female.  The history is provided by the patient.  Palpitations   This is a recurrent problem. The current episode started more than 1 week ago. The problem occurs daily. The problem has been resolved. The problem is associated with an unknown factor. Pertinent negatives include no diaphoresis, no fever, no numbness, no chest pain, no exertional chest pressure, no irregular heartbeat, no near-syncope, no orthopnea, no PND, no syncope, no abdominal pain, no nausea, no vomiting, no headaches, no back pain, no leg pain, no lower extremity edema, no dizziness, no weakness, no cough, no hemoptysis, no shortness of breath and no sputum production. She has tried nothing for the symptoms. The treatment provided no relief. Risk factors: recent diagnosis of PAC by cardiology and started on metoprolol. Her past medical history is significant for valve disorder. Her past medical history does not include hyperthyroidism.    Past Medical History:  Diagnosis Date  . Arthritis   . Chronic knee pain   . Diverticulosis 2008  . GERD (gastroesophageal reflux disease)   . History of stress test 10/2010   which she execised a 7 met workload achieving a peak heart rate of 141, representing 93% of predicted maximum. Scintigraphic images revealed ormal perfusion.  [name] Hx of echocardiogram 10/2010   which revealed normal systolic as well as diastolic function. she did have mild mitral annular calcification with mild MR and TR. She had mild aortic sclerosis without stenosis.  . Hyperlipidemia   . Hypertension   . Kidney stones   . Kidney stones   . Obesity   . Vitamin D deficiency     Patient Active Problem List   Diagnosis Date Noted  . Essential hypertension 07/10/2015  .  Mild aortic insufficiency 07/08/2014  . Mild mitral regurgitation 07/08/2014  . GERD (gastroesophageal reflux disease) 07/08/2014  . Hyperlipidemia 05/11/2013  . HTN (hypertension) 05/11/2013  . Aortic valve sclerosis 05/11/2013  . Chest pain 10/09/2011  . Hypokalemia 10/09/2011  . Thrombocytopenia ([place]) 10/09/2011    Past Surgical History:  Procedure Laterality Date  . CHONDROPLASTY  08/19/2016   Procedure: CHONDROPLASTY patellar femoral joint, medial femoral condyle;  Surgeon: Dorna Leitz, MD;  Location: [place];  Service: Orthopedics;;  . KIDNEY STONE SURGERY    . KNEE ARTHROSCOPY WITH EXCISION PLICA Left 01/19/2093   Procedure: KNEE ARTHROSCOPY WITH EXCISION PLICA;  Surgeon: Dorna Leitz, MD;  Location: [place];  Service: Orthopedics;  Laterality: Left;  . KNEE ARTHROSCOPY WITH MEDIAL MENISECTOMY Left 08/19/2016   Procedure: KNEE ARTHROSCOPY WITH MEDIAL MENISECTOMY;  Surgeon: Dorna Leitz, MD;  Location: [place];  Service: Orthopedics;  Laterality: Left;  . Orthroscopic Rt Knee       OB History   None      Home Medications    Prior to Admission medications   Medication Sig Start Date End Date Taking? Authorizing Provider  simvastatin (ZOCOR) 20 MG tablet Take 1 tablet (20 mg total) by mouth at bedtime. 10/24/13  Yes Troy Sine, MD  aspirin EC 81 MG tablet Take 81 mg by mouth daily.    [provider]  Cholecalciferol (VITAMIN D3) 2000 UNITS capsule Take 2,000 Units by mouth daily.    [provider]  hydrochlorothiazide (HYDRODIURIL) 25 MG tablet Take 12.5 mg by mouth daily.     [provider]  metoprolol tartrate (LOPRESSOR) 25 MG tablet Take 1 tablet (25 mg total) by mouth 2 (two) times daily. 03/20/18 06/18/18  Almyra Deforest, PA    Family History Family History  Problem Relation Age of Onset  . Heart disease Mother   . Dementia Mother   . Lung cancer Father   . Stroke Maternal Grandmother   .  Heart disease Maternal Grandmother   . Colon cancer Neg Hx   . Esophageal cancer Neg Hx   . Rectal cancer Neg Hx   . Stomach cancer Neg Hx     Social History Social History   Tobacco Use  . Smoking status: Never Smoker  . Smokeless tobacco: Never Used  Substance Use Topics  . Alcohol use: Yes    Comment: wine occ  . Drug use: No     Allergies   Patient has no known allergies.   Review of Systems Review of Systems  Constitutional: Negative for chills, diaphoresis and fever.  HENT: Negative for ear pain and sore throat.   Eyes: Negative for pain and visual disturbance.  Respiratory: Negative for cough, hemoptysis, sputum production and shortness of breath.   Cardiovascular: Positive for palpitations. Negative for chest pain, orthopnea, syncope, PND and near-syncope.  Gastrointestinal: Negative for abdominal pain, nausea and vomiting.  Genitourinary: Negative for dysuria and hematuria.  Musculoskeletal: Negative for arthralgias and back pain.  Skin: Negative for color change and rash.  Neurological: Negative for dizziness, seizures, syncope, weakness, numbness and headaches.  All other systems reviewed and are negative.    Physical Exam Updated Vital Signs  ED Triage Vitals  Enc Vitals Group     BP 03/26/18 2147 (!) 144/71     Pulse Rate 03/26/18 2147 81     Resp 03/26/18 2147 19     Temp 03/26/18 2147 99.6 F (37.6 C)     Temp Source 03/26/18 2147 Oral     SpO2 03/26/18 2147 94 %     Weight 03/26/18 2151 166 lb (75.3 kg)     Height 03/26/18 2151 5' 5"  (1.651 m)     Head Circumference --      Peak Flow --      Pain Score 03/26/18 2151 0     Pain Loc --      Pain Edu? --      Excl. in [place]? --     Physical Exam  Constitutional: She appears well-developed and well-nourished. No distress.  HENT:  Head: Normocephalic and atraumatic.  Eyes: Pupils are equal, round, and reactive to light. Conjunctivae are normal.  Neck: Normal range of motion. Neck supple.    Cardiovascular: Normal rate, regular rhythm, normal heart sounds and intact distal pulses.  No murmur heard. Pulmonary/Chest: Effort normal and breath sounds normal. No stridor. No respiratory distress. She has no wheezes.  Abdominal: Soft. There is no tenderness.  Musculoskeletal: Normal range of motion. She exhibits no edema.  Neurological: She is alert.  Skin: Skin is warm and dry.  Psychiatric: She has a normal mood and affect.  Nursing note and vitals reviewed.    ED Treatments / Results  Labs (all labs ordered are listed, but only abnormal results are displayed) Labs Reviewed  BASIC METABOLIC PANEL - Abnormal; Notable for the following components:      Result Value   Glucose, Bld 103 (*)    All other  components within normal limits  CBC  TROPONIN I  MAGNESIUM    EKG EKG Interpretation  Date/Time:  Monday March 26 2018 21:49:05 EDT Ventricular Rate:  79 PR Interval:    QRS Duration: 85 QT Interval:  414 QTC Calculation: 475 R Axis:   73 Text Interpretation:  Sinus rhythm Baseline wander in lead(s) II No change from prior. Confirmed by Lennice Sites [phone]) on 03/26/2018 10:00:24 PM   Radiology No results found.  Procedures Procedures (including critical care time)  Medications Ordered in ED Medications - No data to display   Initial Impression / Assessment and Plan / ED Course  I have reviewed the triage vital signs and the nursing notes.  Pertinent labs & imaging results that were available during my care of the patient were reviewed by me and considered in my medical decision making (see chart for details).     AIDYNN POLENDO is a 77 year old female history of hypertension, high cholesterol, valvular disease, PACs who presents to the ED with palpitations.  Patient with normal vitals.  No fever.  EKG upon arrival shows normal sinus rhythm with no signs of ischemic changes.  Patient with no chest pain.  Troponin was drawn in triage and was within normal  limits.  Doubt ACS.  Patient denies any dizziness, shortness of breath, headaches, change in vision.  Patient states that she was recently started on metoprolol for PACs and has follow-up with her cardiologist in 2 weeks.  Patient states that intermittently she feels palpitations but no other symptoms.  She does not feel like she is going to pass out, no chest pain, no shortness of breath when she has the palpitations.  She does state that they do make her slightly anxious.  Patient had basic labs drawn that showed no signs of electrolyte abnormality, acute kidney injury.  Patient with normal magnesium.  Patient had TSH done recently that was normal.  Overall work-up was unremarkable.  Patient likely with symptomatic PACs and recommend continued metoprolol which was just recently started and follow-up with cardiologist as previously scheduled.  Understands to limit caffeine intake and was discharged from ED good condition and told to return to the ED if symptoms worsen.  Final Clinical Impressions(s) / ED Diagnoses   Final diagnoses:  Palpitations    ED Discharge Orders    None       Lennice Sites, DO 03/27/18 0005

## 2018-03-26 NOTE — ED Triage Notes (Signed)
Pt states for a few days she feels "like my hearts not really in rhythm."

## 2018-03-27 ENCOUNTER — Encounter (HOSPITAL_BASED_OUTPATIENT_CLINIC_OR_DEPARTMENT_OTHER): Payer: Self-pay | Admitting: Emergency Medicine

## 2018-04-10 ENCOUNTER — Encounter: Payer: Self-pay | Admitting: Physician Assistant

## 2018-04-10 ENCOUNTER — Ambulatory Visit: Payer: Medicare Other | Admitting: Physician Assistant

## 2018-04-10 VITALS — BP 120/72 | HR 70 | Ht 65.0 in | Wt 164.8 lb

## 2018-04-10 DIAGNOSIS — R002 Palpitations: Secondary | ICD-10-CM | POA: Diagnosis not present

## 2018-04-10 DIAGNOSIS — E785 Hyperlipidemia, unspecified: Secondary | ICD-10-CM

## 2018-04-10 DIAGNOSIS — I10 Essential (primary) hypertension: Secondary | ICD-10-CM

## 2018-04-10 DIAGNOSIS — I351 Nonrheumatic aortic (valve) insufficiency: Secondary | ICD-10-CM

## 2018-04-10 NOTE — Patient Instructions (Signed)
Medication Instructions:  Your physician recommends that you continue on your current medications as directed. Please refer to the Current Medication list given to you today.  If you need a refill on your cardiac medications before your next appointment, please call your pharmacy.  Labwork: None   Testing/Procedures: None   Follow-Up: Your physician wants you to follow-up in: 6 months with Dr Kelly. You will receive a reminder letter in the mail two months in advance. If you don't receive a letter, please call our office to schedule the follow-up appointment.  Any Other Special Instructions Will Be Listed Below (If Applicable).        

## 2018-04-10 NOTE — Progress Notes (Signed)
Cardiology Office Note    Date:  04/10/2018   ID:  [name] [name], DOB 04-05-1941, MRN [id number]  PCP:  Orpah Melter, MD  Cardiologist:  Dr. Claiborne Billings  Chief Complaint  Patient presents with  . Follow-up    seen for Dr. Claiborne Billings.     History of Present Illness:  [name] [name] is a 77 y.o. female with PMH of diverticulosis, GERD, hypertension, hyperlipidemia, and history of vitamin D deficiency.  Previous Myoview obtained on 11/01/2010 showed EF 73%, normal perfusion.  Echocardiogram obtained on 02/07/2017 showed EF 60 to 65%, mild AI.  She was evaluated in the ED on 01/25/2017 for chest pain.  CT angiogram of the chest showed no evidence of PE, however multiple lung nodules were noted.  This was followed up by a repeat CT of the chest with contrast in September 2018, to small lung nodules were stable and appears to be unchanged, LAD in the left circumflex coronary artery calcification was also noted.  I last saw the patient on 03/20/2018, she was complaining of occasional fluttering sensation.  EKG strips shows she had some frequent PACs.  I discontinue her amlodipine and switch her to metoprolol tartrate 25 mg twice daily.  She was in the emergency room on 03/26/2018 with recurrent palpitation.  She says her palpitation persistent in the emergency room while she was on the monitor.  ED physician did not mention significant arrhythmia on the monitor.  Since then, she has not had a single palpitation in the past 2 weeks.  She thinks that metoprolol is working very well for her.  She is also trying to limit caffeine intake.  Otherwise, I will continue her on the current medication.  She does not have any chest pain, lower extremity edema, orthopnea or PND.  I am okay with her to drink a little bit of alcohol this Saturday during her granddaughter's engagement party.  I did ask her to contact us if she does have any recurrent palpitation, if that is the case, I would recommend a 30-day event  monitor.    Past Medical History:  Diagnosis Date  . Arthritis   . Chronic knee pain   . Diverticulosis 2008  . GERD (gastroesophageal reflux disease)   . History of stress test 10/2010   which she execised a 7 met workload achieving a peak heart rate of 141, representing 93% of predicted maximum. Scintigraphic images revealed ormal perfusion.  [name] Hx of echocardiogram 10/2010   which revealed normal systolic as well as diastolic function. she did have mild mitral annular calcification with mild MR and TR. She had mild aortic sclerosis without stenosis.  . Hyperlipidemia   . Hypertension   . Kidney stones   . Kidney stones   . Obesity   . Vitamin D deficiency     Past Surgical History:  Procedure Laterality Date  . CHONDROPLASTY  08/19/2016   Procedure: CHONDROPLASTY patellar femoral joint, medial femoral condyle;  Surgeon: Dorna Leitz, MD;  Location: [facility];  Service: Orthopedics;;  . KIDNEY STONE SURGERY    . KNEE ARTHROSCOPY WITH EXCISION PLICA Left 11/19/9627   Procedure: KNEE ARTHROSCOPY WITH EXCISION PLICA;  Surgeon: Dorna Leitz, MD;  Location: [facility];  Service: Orthopedics;  Laterality: Left;  . KNEE ARTHROSCOPY WITH MEDIAL MENISECTOMY Left 08/19/2016   Procedure: KNEE ARTHROSCOPY WITH MEDIAL MENISECTOMY;  Surgeon: Dorna Leitz, MD;  Location: [facility];  Service: Orthopedics;  Laterality: Left;  . Orthroscopic Rt  Knee      Current Medications: Outpatient Medications Prior to Visit  Medication Sig Dispense Refill  . aspirin EC 81 MG tablet Take 81 mg by mouth daily.    . Cholecalciferol (VITAMIN D3) 2000 UNITS capsule Take 2,000 Units by mouth daily.    . hydrochlorothiazide (HYDRODIURIL) 25 MG tablet Take 12.5 mg by mouth daily.     . metoprolol tartrate (LOPRESSOR) 25 MG tablet Take 1 tablet (25 mg total) by mouth 2 (two) times daily. 60 tablet 6  . simvastatin (ZOCOR) 20 MG tablet Take 1 tablet (20 mg total) by mouth at  bedtime. 30 tablet 6   No facility-administered medications prior to visit.      Allergies:   Patient has no known allergies.   Social History   Socioeconomic History  . Marital status: Widowed    Spouse name: Not on file  . Number of children: 2  . Years of education: Not on file  . Highest education level: Not on file  Occupational History  . Occupation: Retired  Scientific laboratory technician  . Financial resource strain: Not on file  . Food insecurity:    Worry: Not on file    Inability: Not on file  . Transportation needs:    Medical: Not on file    Non-medical: Not on file  Tobacco Use  . Smoking status: Never Smoker  . Smokeless tobacco: Never Used  Substance and Sexual Activity  . Alcohol use: Yes    Comment: wine occ  . Drug use: No  . Sexual activity: Not on file  Lifestyle  . Physical activity:    Days per week: Not on file    Minutes per session: Not on file  . Stress: Not on file  Relationships  . Social connections:    Talks on phone: Not on file    Gets together: Not on file    Attends religious service: Not on file    Active member of club or organization: Not on file    Attends meetings of clubs or organizations: Not on file    Relationship status: Not on file  Other Topics Concern  . Not on file  Social History Narrative  . Not on file     Family History:  The patient's family history includes Dementia in her mother; Heart disease in her maternal grandmother and mother; Lung cancer in her father; Stroke in her maternal grandmother.   ROS:   Please see the history of present illness.    ROS All other systems reviewed and are negative.   PHYSICAL EXAM:   VS:  BP 120/72   Pulse 70   Ht 5' 5"  (1.651 m)   Wt 164 lb 12.8 oz (74.8 kg)   BMI 27.42 kg/m    GEN: Well nourished, well developed, in no acute distress  HEENT: normal  Neck: no JVD, carotid bruits, or masses Cardiac: Irregularly irregular; no rubs, or gallops,no edema   2/6 diastolic  murmur Respiratory:  clear to auscultation bilaterally, normal work of breathing GI: soft, nontender, nondistended, + BS MS: no deformity or atrophy  Skin: warm and dry, no rash Neuro:  Alert and Oriented x 3, Strength and sensation are intact Psych: euthymic mood, full affect  Wt Readings from Last 3 Encounters:  04/10/18 164 lb 12.8 oz (74.8 kg)  03/26/18 166 lb (75.3 kg)  03/20/18 168 lb (76.2 kg)      Studies/Labs Reviewed:   EKG:  EKG is not ordered today.  Recent Labs: 03/20/2018: TSH 1.560 03/26/2018: BUN 16; Creatinine, Ser 0.74; Hemoglobin 14.2; Magnesium 2.1; Platelets 225; Potassium 3.9; Sodium 139   Lipid Panel    Component Value Date/Time   CHOL 145 11/29/2013 1034   CHOL 181 05/20/2013 0909   TRIG 77 11/29/2013 1034   TRIG 86 05/20/2013 0909   HDL 52 11/29/2013 1034   HDL 49 05/20/2013 0909   CHOLHDL 2.8 11/29/2013 1034   VLDL 15 11/29/2013 1034   LDLCALC 78 11/29/2013 1034   LDLCALC 115 (H) 05/20/2013 0909    Additional studies/ records that were reviewed today include:   Echo 02/07/2017 LV EF: 60% -   65% Study Conclusions  - Left ventricle: The cavity size was normal. Systolic function was   normal. The estimated ejection fraction was in the range of 60%   to 65%. Wall motion was normal; there were no regional wall   motion abnormalities. Left ventricular diastolic function   parameters were normal. - Aortic valve: There was mild regurgitation. - Mitral valve: Calcified annulus. - Atrial septum: No defect or patent foramen ovale was identified.   ASSESSMENT:    1. Palpitation   2. Essential hypertension   3. Hyperlipidemia, unspecified hyperlipidemia type   4. Nonrheumatic aortic valve insufficiency      PLAN:  In order of problems listed above:  1. Palpitation: Seems to be very well controlled on the current dose of metoprolol.  If her symptom does recur later, I would recommend a 30-day event monitor.  2. Hypertension: Blood  pressure stable on current medication  3. Hyperlipidemia: On Zocor, followed by primary care provider with annual labs  4. Aortic insufficiency: Appears to be mild on the last echocardiogram in 2018.    Medication Adjustments/Labs and Tests Ordered: Current medicines are reviewed at length with the patient today.  Concerns regarding medicines are outlined above.  Medication changes, Labs and Tests ordered today are listed in the Patient Instructions below. Patient Instructions  Medication Instructions:  Your physician recommends that you continue on your current medications as directed. Please refer to the Current Medication list given to you today. If you need a refill on your cardiac medications before your next appointment, please call your pharmacy.  Labwork: None  Testing/Procedures: None   Follow-Up: Your physician wants you to follow-up in: 6 months with Dr Claiborne Billings. You will receive a reminder letter in the mail two months in advance. If you don't receive a letter, please call our office to schedule the follow-up appointment.  Any Other Special Instructions Will Be Listed Below (If Applicable).           Hilbert Corrigan, [place]  04/10/2018 11:50 AM    [address], [place], [place]  [address] Phone: [phone]; Fax: [phone]

## 2018-05-29 ENCOUNTER — Telehealth: Payer: Self-pay | Admitting: Physician Assistant

## 2018-05-29 NOTE — Telephone Encounter (Signed)
Reassured patient that her blood pressures are ok.She thinks she may have got up to fast the other day and had a brief episode of dizziness.She has stopped drinking sweet tea almost entirely and palpitations are much better.

## 2018-05-29 NOTE — Telephone Encounter (Signed)
New message:      Pt c/o BP issue: STAT if pt c/o blurred vision, one-sided weakness or slurred speech  1. What are your last 5 BP readings? 102/58 yesterday/112/64 today  2. Are you having any other symptoms (ex. Dizziness, headache, blurred vision, passed out)? Pt states had a small dizzy spell but didn't last long at all  3. What is your BP issue? Pt states her home care nurse stated she needed to call us pertaining to her BP

## 2018-09-11 ENCOUNTER — Other Ambulatory Visit: Payer: Self-pay | Admitting: Physician Assistant

## 2018-11-02 ENCOUNTER — Telehealth: Payer: Self-pay

## 2018-11-02 NOTE — Telephone Encounter (Signed)
Error

## 2018-11-02 NOTE — Telephone Encounter (Signed)
   Cardiac Questionnaire:    Since your last visit or hospitalization:    1. Have you been having new or worsening chest pain? NO   2. Have you been having new or worsening shortness of breath? NO 3. Have you been having new or worsening leg swelling, wt gain, or increase in abdominal girth (pants fitting more tightly)? NO   4. Have you had any passing out spells? NO     Primary Cardiologist: Dr.Kelly  Patient contacted.  History reviewed.  No symptoms to suggest any unstable cardiac conditions.  Based on discussion, with current pandemic situation, we will be postponing this appointment for [name] [name].  If symptoms change, she has been instructed to contact our office.   Routing to C19 CANCEL pool for tracking (P CV DIV CV19 CANCEL) and assigning priority (1 = 4-6 wks, 2 = 6-12 wks, 3 = >12 wks).  Priority 3  Cydney Ok, LPN  9/47/6546 5:03 PM         .

## 2018-11-06 ENCOUNTER — Ambulatory Visit: Payer: Medicare Other | Admitting: Cardiovascular Disease

## 2018-12-07 ENCOUNTER — Other Ambulatory Visit: Payer: Self-pay | Admitting: Physician Assistant

## 2018-12-07 NOTE — Telephone Encounter (Signed)
Lopressor 25 mg refilled 

## 2018-12-21 ENCOUNTER — Telehealth: Payer: Self-pay | Admitting: Cardiovascular Disease

## 2018-12-21 NOTE — Telephone Encounter (Signed)
° ° °  Pt c/o of Chest Pain: STAT if CP now or developed within 24 hours  1. Are you having CP right now? no 2. Are you experiencing any other symptoms (ex. SOB, nausea, vomiting, sweating)? Back pain  3. How long have you been experiencing CP? 1 day  4. Is your CP continuous or coming and going? Coming and going  5. Have you taken Nitroglycerin? No  ?

## 2018-12-21 NOTE — Telephone Encounter (Signed)
[name] [name], can I please place this schedule on [name]'s appointment for May 22nd on Friday for televisit?   Thanks!

## 2018-12-21 NOTE — Telephone Encounter (Signed)
Called and spoke with patient, she states that if she moves her arm a certain way she will get the pain in her shoulder, into her should blade pain on the left side she noticed it yesterday and then today- it comes and goes.   Denies fever, SOB, swelling in hands/feet, and diet changes. Rated pain around 4 if continuous.   Patient denies lifting anything to cause muscular spasms.  HR 63 BP 110/73.   Patient wanted to be evaluated before the weekend.  Patient had no had any pain medication to see if it helps with the pain- does not have nitro to take.

## 2018-12-21 NOTE — Telephone Encounter (Signed)
   Notified by triage that patient called with complaints of shoulder pain/chest pain. Patient states a few days ago she began experiencing some left shoulder pain which radiated to her chest. She described the pain as a tooth ache sensation without associated SOB, diaphoresis, N/V, dizziness, or lightheadedness. She states the pain lasted for seconds and resolved spontaneously. She states she had 1 episode today and 2-3 episodes the day before. She reported picking up some pepcid which helped. Symptoms sound more typical of GI related pain, however she has been noted to have CAD on prior CT scans of her chest. It is possible this is an anginal equivalent. Last ischemic evaluation was a stress test in 2012. Patient was instructed to continue taking pepcid once daily for the next several days to see if that helps. Patient instructed to present to the ED if she experiences chest pain lasting minutes/hours. Patient in agreement with the plan. Will route to scheduling to try to coordinate a telemedicine visit with Dr. Loma Sousa in the next week.  Beatriz Stallion, PA-C 12/21/18, 4:19 PM

## 2018-12-24 ENCOUNTER — Telehealth: Payer: Self-pay

## 2018-12-24 NOTE — Telephone Encounter (Signed)
Virtual Visit Pre-Appointment Phone Call  "(Name), I am calling you today to discuss your upcoming appointment. We are currently trying to limit exposure to the virus that causes COVID-19 by seeing patients at home rather than in the office."  1. "What is the BEST phone number to call the day of the visit?" - include this in appointment notes  2. "Do you have or have access to (through a family member/friend) a smartphone with video capability that we can use for your visit?" a. If yes - list this number in appt notes as "cell" (if different from BEST phone #) and list the appointment type as a VIDEO visit in appointment notes b. If no - list the appointment type as a PHONE visit in appointment notes  3. Confirm consent - "In the setting of the current Covid19 crisis, you are scheduled for a PHONE visit with your provider on 01/04/2019 at 8:30AM.  Just as we do with many in-office visits, in order for you to participate in this visit, we must obtain consent.  If you'd like, I can send this to your mychart (if signed up) or email for you to review.  Otherwise, I can obtain your verbal consent now.  All virtual visits are billed to your insurance company just like a normal visit would be.  By agreeing to a virtual visit, we'd like you to understand that the technology does not allow for your provider to perform an examination, and thus may limit your provider's ability to fully assess your condition. If your provider identifies any concerns that need to be evaluated in person, we will make arrangements to do so.  Finally, though the technology is pretty good, we cannot assure that it will always work on either your or our end, and in the setting of a video visit, we may have to convert it to a phone-only visit.  In either situation, we cannot ensure that we have a secure connection.  Are you willing to proceed?" STAFF: Did the patient verbally acknowledge consent to telehealth visit? Document YES/NO  here: YES  4. Advise patient to be prepared - "Two hours prior to your appointment, go ahead and check your blood pressure, pulse, oxygen saturation, and your weight (if you have the equipment to check those) and write them all down. When your visit starts, your provider will ask you for this information. If you have an Apple Watch or Kardia device, please plan to have heart rate information ready on the day of your appointment. Please have a pen and paper handy nearby the day of the visit as well."  5. Give patient instructions for MyChart download to smartphone OR [email or web address] as below if video visit (depending on what platform provider is using)  6. Inform patient they will receive a phone call 15 minutes prior to their appointment time (may be from unknown caller ID) so they should be prepared to answer    TELEPHONE CALL NOTE  [name] [name] has been deemed a candidate for a follow-up tele-health visit to limit community exposure during the Covid-19 pandemic. I spoke with the patient via phone to ensure availability of phone/video source, confirm preferred email & phone number, and discuss instructions and expectations.  I reminded [name] [name] to be prepared with any vital sign and/or heart rhythm information that could potentially be obtained via home monitoring, at the time of her visit. I reminded [name] [name] to expect a phone call prior to her visit.  Dorris Fetch, CMA 12/24/2018 3:48 PM   INSTRUCTIONS FOR DOWNLOADING THE MYCHART APP TO SMARTPHONE  - The patient must first make sure to have activated MyChart and know their login information - If Apple, go to [employer] and type in MyChart in the search bar and download the app. If Android, ask patient to go to [employer] and type in [place] in the search bar and download the app. The app is free but as with any other app downloads, their phone may require them to verify saved payment information or Apple/Android  password.  - The patient will need to then log into the app with their MyChart username and password, and select [facility] as their healthcare provider to link the account. When it is time for your visit, go to the MyChart app, find appointments, and click Begin Video Visit. Be sure to Select Allow for your device to access the Microphone and Camera for your visit. You will then be connected, and your provider will be with you shortly.  **If they have any issues connecting, or need assistance please contact MyChart service desk [phone])**  **If using a computer, in order to ensure the best quality for their visit they will need to use either of the following Internet Browsers: [employer] or Google Chrome**  IF USING DOXIMITY or DOXY.ME - The patient will receive a link just prior to their visit by text.     FULL LENGTH CONSENT FOR TELE-HEALTH VISIT   I hereby voluntarily request, consent and authorize [facility] and its employed or contracted physicians, physician assistants, nurse practitioners or other licensed health care professionals (the Practitioner), to provide me with telemedicine health care services (the "Services") as deemed necessary by the treating Practitioner. I acknowledge and consent to receive the Services by the Practitioner via telemedicine. I understand that the telemedicine visit will involve communicating with the Practitioner through live audiovisual communication technology and the disclosure of certain medical information by electronic transmission. I acknowledge that I have been given the opportunity to request an in-person assessment or other available alternative prior to the telemedicine visit and am voluntarily participating in the telemedicine visit.  I understand that I have the right to withhold or withdraw my consent to the use of telemedicine in the course of my care at any time, without affecting my right to future care or treatment,  and that the Practitioner or I may terminate the telemedicine visit at any time. I understand that I have the right to inspect all information obtained and/or recorded in the course of the telemedicine visit and may receive copies of available information for a reasonable fee.  I understand that some of the potential risks of receiving the Services via telemedicine include:  [name] Delay or interruption in medical evaluation due to technological equipment failure or disruption; . Information transmitted may not be sufficient (e.g. poor resolution of images) to allow for appropriate medical decision making by the Practitioner; and/or  . In rare instances, security protocols could fail, causing a breach of personal health information.  Furthermore, I acknowledge that it is my responsibility to provide information about my medical history, conditions and care that is complete and accurate to the best of my ability. I acknowledge that Practitioner's advice, recommendations, and/or decision may be based on factors not within their control, such as incomplete or inaccurate data provided by me or distortions of diagnostic images or specimens that may result from electronic transmissions. I understand that the  practice of medicine is not an Chief Strategy Officer and that Practitioner makes no warranties or guarantees regarding treatment outcomes. I acknowledge that I will receive a copy of this consent concurrently upon execution via email to the email address I last provided but may also request a printed copy by calling the office of [facility].    I understand that my insurance will be billed for this visit.   I have read or had this consent read to me. . I understand the contents of this consent, which adequately explains the benefits and risks of the Services being provided via telemedicine.  . I have been provided ample opportunity to ask questions regarding this consent and the Services and have had my questions  answered to my satisfaction. . I give my informed consent for the services to be provided through the use of telemedicine in my medical care  By participating in this telemedicine visit I agree to the above.

## 2018-12-24 NOTE — Telephone Encounter (Signed)
Noted, thank you

## 2018-12-24 NOTE — Telephone Encounter (Signed)
Patient is scheduled for a virtual phone visit with Azalee Course, PA-C for 01/04/2019 at 8:30AM

## 2019-01-02 ENCOUNTER — Telehealth: Payer: Self-pay | Admitting: Physician Assistant

## 2019-01-02 NOTE — Telephone Encounter (Signed)
smartphone/ consent/ my chart via email/ pre reg completed  °

## 2019-01-04 ENCOUNTER — Other Ambulatory Visit: Payer: Self-pay

## 2019-01-04 ENCOUNTER — Encounter: Payer: Self-pay | Admitting: Physician Assistant

## 2019-01-04 ENCOUNTER — Ambulatory Visit: Payer: Medicare Other | Admitting: Physician Assistant

## 2019-01-04 VITALS — BP 110/70 | HR 62 | Temp 97.7°F | Ht 65.0 in | Wt 168.0 lb

## 2019-01-04 DIAGNOSIS — R079 Chest pain, unspecified: Secondary | ICD-10-CM | POA: Diagnosis not present

## 2019-01-04 DIAGNOSIS — I10 Essential (primary) hypertension: Secondary | ICD-10-CM | POA: Diagnosis not present

## 2019-01-04 DIAGNOSIS — K219 Gastro-esophageal reflux disease without esophagitis: Secondary | ICD-10-CM | POA: Diagnosis not present

## 2019-01-04 MED ORDER — PANTOPRAZOLE SODIUM 40 MG PO TBEC: 40.0000 mg | DELAYED_RELEASE_TABLET | Freq: Every day | ORAL | 3 refills | Status: DC

## 2019-01-04 NOTE — Patient Instructions (Signed)
Medication Instructions:   START PROTONIX 40 MG DAILY   If you need a refill on your cardiac medications before your next appointment, please call your pharmacy.   Lab work:  NONE ordered at this time of appointment   If you have labs (blood work) drawn today and your tests are completely normal, you will receive your results only by: [name] MyChart Message (if you have MyChart) OR . A paper copy in the mail If you have any lab test that is abnormal or we need to change your treatment, we will call you to review the results.  Testing/Procedures:  Your physician has requested that you have a Lexiscan Myoview. For further information please visit [email or web address] Please follow instruction sheet, as given.   Follow-Up: At [facility], you and your health needs are our priority.  As part of our continuing mission to provide you with exceptional heart care, we have created designated Provider Care Teams.  These Care Teams include your primary Cardiologist (physician) and Advanced Practice Providers (APPs -  Physician Assistants and Nurse Practitioners) who all work together to provide you with the care you need, when you need it. You will need a follow up appointment in 3 months.  Please call our office 2 months in advance to schedule this appointment.  You may see Nicki Guadalajara, MD or one of the following Advanced Practice Providers on your designated Care Team: [place], [place] . Micah Flesher, PA-C  Any Other Special Instructions Will Be Listed Below (If Applicable).

## 2019-01-04 NOTE — Progress Notes (Signed)
Cardiology Office Note    Date:  01/04/2019   ID:  [name] [name] 31-Dec-1940, MRN [id number]  PCP:  Orpah Melter, MD  Cardiologist: Dr. Claiborne Billings  Chief Complaint  Patient presents with  . Follow-up  . Chest Pain    History of Present Illness:  [name] [name] is a 78 y.o. female presents today for evaluation of her left-sided chest   and shoulder discomfort.  She states that it has been intermittent over the last 2 weeks.  She denies exertional chest pain.  She endorses relief of his discomfort with coke and over-the-counter Pepcid.  Past medical history of GERD, hypertension, hyperlipidemia, diverticulosis and vitamin D deficiency.  Last Myoview 11/01/2010 showed EF 73%, and normal findings.  Echocardiogram from 02/07/2017 showed EF 60 to 65%, with mild AI.  She was also evaluated in the ED on 01/25/2017 for chest pain.  CT angiogram of chest showed no evidence of PE, however it did show multiple lung nodules.  A repeat CT chest with contrast on September 2018 showed small stable lung nodules that were unchanged.  LAD and left circumflex coronary artery calcification were also noted on prior chest CT.  Patient was seen in August 2019 with complaint of occasional fluttering sensation in her chest.  EKG showed frequent PACs.  At that time her amlodipine was discontinued and she was started on metoprolol tartrate she then presented to the ED on 03/26/2018 for recurrent palpitation.  ED physician did not mention significant arrhythmia.  Patient was again seen on 04/10/2018, and was doing well at that time.    Past Medical History:  Diagnosis Date  . Arthritis   . Chronic knee pain   . Diverticulosis 2008  . GERD (gastroesophageal reflux disease)   . History of stress test 10/2010   which she execised a 7 met workload achieving a peak heart rate of 141, representing 93% of predicted maximum. Scintigraphic images revealed ormal perfusion.  [name] Hx of echocardiogram 10/2010   which  revealed normal systolic as well as diastolic function. she did have mild mitral annular calcification with mild MR and TR. She had mild aortic sclerosis without stenosis.  . Hyperlipidemia   . Hypertension   . Kidney stones   . Kidney stones   . Obesity   . Vitamin D deficiency     Past Surgical History:  Procedure Laterality Date  . CHONDROPLASTY  08/19/2016   Procedure: CHONDROPLASTY patellar femoral joint, medial femoral condyle;  Surgeon: Dorna Leitz, MD;  Location: [place];  Service: Orthopedics;;  . KIDNEY STONE SURGERY    . KNEE ARTHROSCOPY WITH EXCISION PLICA Left 01/18/629   Procedure: KNEE ARTHROSCOPY WITH EXCISION PLICA;  Surgeon: Dorna Leitz, MD;  Location: [place];  Service: Orthopedics;  Laterality: Left;  . KNEE ARTHROSCOPY WITH MEDIAL MENISECTOMY Left 08/19/2016   Procedure: KNEE ARTHROSCOPY WITH MEDIAL MENISECTOMY;  Surgeon: Dorna Leitz, MD;  Location: [place];  Service: Orthopedics;  Laterality: Left;  . Orthroscopic Rt Knee      Current Medications: Outpatient Medications Prior to Visit  Medication Sig Dispense Refill  . aspirin EC 81 MG tablet Take 81 mg by mouth daily.    . Cholecalciferol (VITAMIN D3) 2000 UNITS capsule Take 2,000 Units by mouth daily.    . hydrochlorothiazide (HYDRODIURIL) 25 MG tablet Take 12.5 mg by mouth daily.     . metoprolol tartrate (LOPRESSOR) 25 MG tablet TAKE 1 TABLET (25 MG TOTAL) BY MOUTH 2 (  TWO) TIMES DAILY. KEEP OV. 180 tablet 0  . simvastatin (ZOCOR) 20 MG tablet Take 1 tablet (20 mg total) by mouth at bedtime. 30 tablet 6   No facility-administered medications prior to visit.      Allergies:   Patient has no known allergies.   Social History   Socioeconomic History  . Marital status: Widowed    Spouse name: Not on file  . Number of children: 2  . Years of education: Not on file  . Highest education level: Not on file  Occupational History  . Occupation: Retired   Scientific laboratory technician  . Financial resource strain: Not on file  . Food insecurity:    Worry: Not on file    Inability: Not on file  . Transportation needs:    Medical: Not on file    Non-medical: Not on file  Tobacco Use  . Smoking status: Never Smoker  . Smokeless tobacco: Never Used  Substance and Sexual Activity  . Alcohol use: Yes    Comment: wine occ  . Drug use: No  . Sexual activity: Not on file  Lifestyle  . Physical activity:    Days per week: Not on file    Minutes per session: Not on file  . Stress: Not on file  Relationships  . Social connections:    Talks on phone: Not on file    Gets together: Not on file    Attends religious service: Not on file    Active member of club or organization: Not on file    Attends meetings of clubs or organizations: Not on file    Relationship status: Not on file  Other Topics Concern  . Not on file  Social History Narrative  . Not on file     Family History:  The patient's family history includes Dementia in her mother; Heart disease in her maternal grandmother and mother; Lung cancer in her father; Stroke in her maternal grandmother.   ROS:   Please see the history of present illness.    Review of Systems  Constitution: Negative for chills, diaphoresis and fever.  Cardiovascular: Positive for chest pain and palpitations. Negative for claudication, leg swelling, near-syncope, orthopnea, paroxysmal nocturnal dyspnea and syncope.  Respiratory: Negative for cough and shortness of breath.   Skin: Negative for color change, dry skin and rash.  Musculoskeletal: Negative for muscle cramps and muscle weakness.  Gastrointestinal: Positive for heartburn. Negative for diarrhea, melena, nausea and vomiting.  Genitourinary: Negative for hematuria.   All other systems reviewed and are negative.   PHYSICAL EXAM:   VS:  BP 110/70   Pulse 62   Temp 97.7 F (36.5 C)   Ht _0  (1.651 m)   Wt 168 lb (76.2 kg)   BMI 27.96 kg/m    GEN: Well  nourished, well developed, in no acute distress  HEENT: normal  Neck: no JVD, carotid bruits, or masses Cardiac: RRR; no murmurs, rubs, or gallops,no edema  Respiratory:  clear to auscultation bilaterally, normal work of breathing GI: soft, nontender, nondistended, + BS MS: no deformity or atrophy  Skin: warm and dry, no rash Neuro:  Alert and Oriented x 3, Strength and sensation are intact Psych: euthymic mood, full affect  Wt Readings from Last 3 Encounters:  01/04/19 168 lb (76.2 kg)  04/10/18 164 lb 12.8 oz (74.8 kg)  03/26/18 166 lb (75.3 kg)      Studies/Labs Reviewed:   EKG:  EKG is ordered today.  The ekg  ordered today demonstrates sinus rhythm.  However there is new T wave inversion in V2 lead.  Recent Labs: 03/20/2018: TSH 1.560 03/26/2018: BUN 16; Creatinine, Ser 0.74; Hemoglobin 14.2; Magnesium 2.1; Platelets 225; Potassium 3.9; Sodium 139   Lipid Panel    Component Value Date/Time   CHOL 145 11/29/2013 1034   CHOL 181 05/20/2013 0909   TRIG 77 11/29/2013 1034   TRIG 86 05/20/2013 0909   HDL 52 11/29/2013 1034   HDL 49 05/20/2013 0909   CHOLHDL 2.8 11/29/2013 1034   VLDL 15 11/29/2013 1034   LDLCALC 78 11/29/2013 1034   LDLCALC 115 (H) 05/20/2013 0909    Additional studies/ records that were reviewed today include:   Echocardiogram 02/07/2017:  Study Conclusions  - Left ventricle: The cavity size was normal. Systolic function was   normal. The estimated ejection fraction was in the range of 60%   to 65%. Wall motion was normal; there were no regional wall   motion abnormalities. Left ventricular diastolic function   parameters were normal. - Aortic valve: There was mild regurgitation. - Mitral valve: Calcified annulus. - Atrial septum: No defect or patent foramen ovale was identified.     ASSESSMENT AND PLAN  In order of problems listed above:   1. Chest pain, unspecified type: Lexiscan Myoview stress test.  Patient educated about typical ACS  symptoms and asked to monitor for pattern of symptoms with exertion.  2. GERD: Protonix 40 mg daily.  3. Essential hypertension: Continue HCTZ 12.5  mg  daily and metoprolol tartrate 25 mg twice daily.  Follow-up with Dr. Claiborne Billings 3 months and as needed.  Medication Adjustments/Labs and Tests Ordered: Current medicines are reviewed at length with the patient today.  Concerns regarding medicines are outlined above.  Medication changes, Labs and Tests ordered today are listed in the Patient Instructions below. There are no Patient Instructions on file for this visit.   Alicia Amel, NP-C 01/04/2019 8:34 AM    [facility] [address], [place], [place]  [address] Phone: [phone]; Fax: [phone]

## 2019-01-08 NOTE — Addendum Note (Signed)
Addended by: Fritz Cauthon L on: 01/08/2019 12:01 PM   Modules accepted: Orders  

## 2019-01-09 ENCOUNTER — Telehealth: Payer: Self-pay | Admitting: Cardiovascular Disease

## 2019-01-09 NOTE — Telephone Encounter (Signed)
New message:   Patient calling concering her appt on Friday and would like for some one to call her with instructions. Please call patient.

## 2019-01-10 ENCOUNTER — Telehealth (HOSPITAL_COMMUNITY): Payer: Self-pay | Admitting: *Deleted

## 2019-01-10 NOTE — Telephone Encounter (Signed)
Close encounter 

## 2019-01-10 NOTE — Telephone Encounter (Signed)
Returned call to patient.She stated she is scheduled for a stress test tomorrow.Stated she is anxious she does not know what to expect.Patient was reassured. Lexiscan Myoview process was reviewed.Patient stated she felt better and voiced appreciation for the call back.

## 2019-01-10 NOTE — Telephone Encounter (Signed)
Patient is still waiting for a call back for instructions for her appt tomorrow.

## 2019-01-11 ENCOUNTER — Ambulatory Visit (HOSPITAL_COMMUNITY)
Admission: RE | Admit: 2019-01-11 | Discharge: 2019-01-11 | Disposition: A | Discharge status: Home / Self Care | Payer: Medicare Other | Source: Ambulatory Visit | Attending: Internal Medicine | Admitting: Internal Medicine

## 2019-01-11 ENCOUNTER — Other Ambulatory Visit: Payer: Self-pay

## 2019-01-11 DIAGNOSIS — R079 Chest pain, unspecified: Secondary | ICD-10-CM | POA: Insufficient documentation

## 2019-01-11 LAB — MYOCARDIAL PERFUSION IMAGING
LV dias vol: 23 mL (ref 46–106)
LV sys vol: 72 mL
Peak HR: 93 {beats}/min
Rest HR: 60 {beats}/min
SDS: 1
SRS: 0
SSS: 1
TID: 1.12

## 2019-01-11 MED ORDER — TECHNETIUM TC 99M TETROFOSMIN IV KIT
30.4000 | PACK | Freq: Once | INTRAVENOUS | Status: AC | PRN
Start: 2019-01-11 — End: 2019-01-11
  Administered 2019-01-11: 30.4 via INTRAVENOUS
  Filled 2019-01-11: qty 31

## 2019-01-11 MED ORDER — TECHNETIUM TC 99M TETROFOSMIN IV KIT
9.8000 | PACK | Freq: Once | INTRAVENOUS | Status: AC | PRN
  Administered 2019-01-11: 9.8 via INTRAVENOUS
  Filled 2019-01-11: qty 10

## 2019-01-11 MED ORDER — REGADENOSON 0.4 MG/5ML IV SOLN
0.4000 mg | Freq: Once | INTRAVENOUS | Status: AC
  Administered 2019-01-11: 0.4 mg via INTRAVENOUS

## 2019-01-15 NOTE — Progress Notes (Signed)
The patient has been notified of the result and verbalized understanding.  All questions (if any) were answered. Dorris Fetch, CMA 01/15/2019 9:17 AM

## 2019-03-04 ENCOUNTER — Other Ambulatory Visit: Payer: Self-pay | Admitting: Cardiovascular Disease

## 2019-03-15 ENCOUNTER — Encounter (HOSPITAL_BASED_OUTPATIENT_CLINIC_OR_DEPARTMENT_OTHER): Payer: Self-pay

## 2019-03-15 ENCOUNTER — Emergency Department (HOSPITAL_BASED_OUTPATIENT_CLINIC_OR_DEPARTMENT_OTHER)
Admission: EM | Admit: 2019-03-15 | Discharge: 2019-03-15 | Disposition: A | Discharge status: Home / Self Care | Payer: Medicare Other | Attending: Emergency Medicine | Admitting: Emergency Medicine

## 2019-03-15 ENCOUNTER — Emergency Department (HOSPITAL_BASED_OUTPATIENT_CLINIC_OR_DEPARTMENT_OTHER): Payer: Medicare Other

## 2019-03-15 ENCOUNTER — Other Ambulatory Visit: Payer: Self-pay

## 2019-03-15 DIAGNOSIS — K5793 Diverticulitis of intestine, part unspecified, without perforation or abscess with bleeding: Secondary | ICD-10-CM | POA: Diagnosis not present

## 2019-03-15 DIAGNOSIS — Z7982 Long term (current) use of aspirin: Secondary | ICD-10-CM | POA: Diagnosis not present

## 2019-03-15 DIAGNOSIS — I2510 Atherosclerotic heart disease of native coronary artery without angina pectoris: Secondary | ICD-10-CM | POA: Diagnosis not present

## 2019-03-15 DIAGNOSIS — Z79899 Other long term (current) drug therapy: Secondary | ICD-10-CM | POA: Insufficient documentation

## 2019-03-15 DIAGNOSIS — R1031 Right lower quadrant pain: Secondary | ICD-10-CM | POA: Diagnosis present

## 2019-03-15 DIAGNOSIS — K5792 Diverticulitis of intestine, part unspecified, without perforation or abscess without bleeding: Secondary | ICD-10-CM

## 2019-03-15 DIAGNOSIS — I10 Essential (primary) hypertension: Secondary | ICD-10-CM | POA: Diagnosis not present

## 2019-03-15 LAB — CBC WITH DIFFERENTIAL/PLATELET
Abs Immature Granulocytes: 0.03 10*3/uL (ref 0.00–0.07)
Basophils Absolute: 0 10*3/uL (ref 0.0–0.1)
Basophils Relative: 0 %
Eosinophils Absolute: 0.1 10*3/uL (ref 0.0–0.5)
Eosinophils Relative: 1 %
HCT: 39.6 % (ref 36.0–46.0)
Hemoglobin: 13.1 g/dL (ref 12.0–15.0)
Immature Granulocytes: 0 %
Lymphocytes Relative: 17 %
Lymphs Abs: 2 10*3/uL (ref 0.7–4.0)
MCH: 31.6 pg (ref 26.0–34.0)
MCHC: 33.1 g/dL (ref 30.0–36.0)
MCV: 95.7 fL (ref 80.0–100.0)
Monocytes Absolute: 1.1 10*3/uL — ABNORMAL HIGH (ref 0.1–1.0)
Monocytes Relative: 9 %
Neutro Abs: 8.7 10*3/uL — ABNORMAL HIGH (ref 1.7–7.7)
Neutrophils Relative %: 73 %
Platelets: 162 10*3/uL (ref 150–400)
RBC: 4.14 MIL/uL (ref 3.87–5.11)
RDW: 12.9 % (ref 11.5–15.5)
WBC: 11.9 10*3/uL — ABNORMAL HIGH (ref 4.0–10.5)
nRBC: 0 % (ref 0.0–0.2)

## 2019-03-15 LAB — URINALYSIS, ROUTINE W REFLEX MICROSCOPIC
Bilirubin Urine: NEGATIVE
Glucose, UA: NEGATIVE mg/dL
Hgb urine dipstick: NEGATIVE
Ketones, ur: NEGATIVE mg/dL
Leukocytes,Ua: NEGATIVE
Nitrite: NEGATIVE
Protein, ur: NEGATIVE mg/dL
Specific Gravity, Urine: 1.02 (ref 1.005–1.030)
pH: 6 (ref 5.0–8.0)

## 2019-03-15 LAB — COMPREHENSIVE METABOLIC PANEL
ALT: 13 U/L (ref 0–44)
AST: 18 U/L (ref 15–41)
Albumin: 3.8 g/dL (ref 3.5–5.0)
Alkaline Phosphatase: 69 U/L (ref 38–126)
Anion gap: 13 (ref 5–15)
BUN: 15 mg/dL (ref 8–23)
CO2: 27 mmol/L (ref 22–32)
Calcium: 9 mg/dL (ref 8.9–10.3)
Chloride: 98 mmol/L (ref 98–111)
Creatinine, Ser: 0.82 mg/dL (ref 0.44–1.00)
GFR calc Af Amer: >60 mL/min (ref >60)
GFR calc non Af Amer: >60 mL/min (ref >60)
Glucose, Bld: 103 mg/dL — ABNORMAL HIGH (ref 70–99)
Potassium: 3.5 mmol/L (ref 3.5–5.1)
Sodium: 138 mmol/L (ref 135–145)
Total Bilirubin: 1.1 mg/dL (ref 0.3–1.2)
Total Protein: 7.1 g/dL (ref 6.5–8.1)

## 2019-03-15 LAB — LIPASE, BLOOD: Lipase: 25 U/L (ref 11–51)

## 2019-03-15 MED ORDER — ACETAMINOPHEN 325 MG PO TABS
650.0000 mg | ORAL_TABLET | Freq: Once | ORAL | Status: AC
  Administered 2019-03-15: 21:00:00 650 mg via ORAL
  Filled 2019-03-15: qty 2

## 2019-03-15 MED ORDER — IOHEXOL 300 MG/ML  SOLN
100.0000 mL | Freq: Once | INTRAMUSCULAR | Status: AC
  Administered 2019-03-15: 100 mL via INTRAVENOUS

## 2019-03-15 MED ORDER — AMOXICILLIN-POT CLAVULANATE 875-125 MG PO TABS: 1.0000 | ORAL_TABLET | Freq: Two times a day (BID) | ORAL | 0 refills | Status: DC

## 2019-03-15 MED ORDER — AMOXICILLIN-POT CLAVULANATE 875-125 MG PO TABS
1.0000 | ORAL_TABLET | Freq: Once | ORAL | Status: AC
  Administered 2019-03-15: 1 via ORAL
  Filled 2019-03-15: qty 1

## 2019-03-15 NOTE — Discharge Instructions (Addendum)
We believe your symptoms are caused by diverticulitis.  Most of the time this condition (please read through the included information) can be cured with outpatient antibiotics.  Please take the full course of prescribed medication(s) and follow up with the doctors recommended above.  Return to the ED if your abdominal pain worsens or fails to improve, you develop bloody vomiting, bloody diarrhea, you are unable to tolerate fluids due to vomiting, fever greater than 101, or other symptoms that concern you.    

## 2019-03-15 NOTE — ED Notes (Signed)
Pt reports low abdominal pain. Saw PMD earlier today, u/a completed at office, sent by office for CT scan to r/o appendicitis

## 2019-03-15 NOTE — ED Provider Notes (Signed)
[facility]ENCY DEPARTMENT Provider Note   CSN: [id number] Arrival date & time: 03/15/19  1736    History   Chief Complaint Chief Complaint  Patient presents with   Abdominal Pain    HPI [name] [name] is a 78 y.o. female with past medical history of diverticulosis, GERD, hypertension, hyperlipidemia, kidney stones presents emergency department today with chief complaint of abdominal pain x1 day.  Patient states pain is located in the right lower quadrant.  Pain radiates to left lower quadrant.  Through out the day she has had episodes of sharp abdominal pain that makes her double over.  She went to her primary care doctor today who is recommending CT scan for possible appendicitis.  She did not take any medications for pain prior to arrival.  Denies fever, chills, chest pain, shortness of breath, nausea, vomiting, urinary symptoms, diarrhea.  She denies abdominal surgical history.    Past Medical History:  Diagnosis Date   Arthritis    Chronic knee pain    Diverticulosis 2008   GERD (gastroesophageal reflux disease)    History of stress test 10/2010   which she execised a 7 met workload achieving a peak heart rate of 141, representing 93% of predicted maximum. Scintigraphic images revealed ormal perfusion.   Hx of echocardiogram 10/2010   which revealed normal systolic as well as diastolic function. she did have mild mitral annular calcification with mild MR and TR. She had mild aortic sclerosis without stenosis.   Hyperlipidemia    Hypertension    Kidney stones    Kidney stones    Obesity    Vitamin D deficiency     Patient Active Problem List   Diagnosis Date Noted   Essential hypertension 07/10/2015   Mild aortic insufficiency 07/08/2014   Mild mitral regurgitation 07/08/2014   GERD (gastroesophageal reflux disease) 07/08/2014   Hyperlipidemia 05/11/2013   HTN (hypertension) 05/11/2013   Aortic valve sclerosis 05/11/2013    Chest pain 10/09/2011   Hypokalemia 10/09/2011   Thrombocytopenia ([place]) 10/09/2011    Past Surgical History:  Procedure Laterality Date   CHONDROPLASTY  08/19/2016   Procedure: CHONDROPLASTY patellar femoral joint, medial femoral condyle;  Surgeon: Dorna Leitz, MD;  Location: [place];  Service: Orthopedics;;   KIDNEY STONE SURGERY     KNEE ARTHROSCOPY WITH EXCISION PLICA Left 10/21/9371   Procedure: KNEE ARTHROSCOPY WITH EXCISION PLICA;  Surgeon: Dorna Leitz, MD;  Location: [place];  Service: Orthopedics;  Laterality: Left;   KNEE ARTHROSCOPY WITH MEDIAL MENISECTOMY Left 08/19/2016   Procedure: KNEE ARTHROSCOPY WITH MEDIAL MENISECTOMY;  Surgeon: Dorna Leitz, MD;  Location: [place];  Service: Orthopedics;  Laterality: Left;   Orthroscopic Rt Knee       OB History   No obstetric history on file.      Home Medications    Prior to Admission medications   Medication Sig Start Date End Date Taking? Authorizing Provider  amoxicillin-clavulanate (AUGMENTIN) 875-125 MG tablet Take 1 tablet by mouth every 12 (twelve) hours. 03/15/19   Gianne Shugars, Harley Hallmark, PA-C  aspirin EC 81 MG tablet Take 81 mg by mouth daily.    [provider]  Cholecalciferol (VITAMIN D3) 2000 UNITS capsule Take 2,000 Units by mouth daily.    [provider]  hydrochlorothiazide (HYDRODIURIL) 25 MG tablet Take 12.5 mg by mouth daily.     [provider]  metoprolol tartrate (LOPRESSOR) 25 MG tablet TAKE 1 TABLET (25 MG TOTAL) BY  MOUTH 2 (TWO) TIMES DAILY. KEEP OV. 03/04/19   Troy Sine, MD  pantoprazole (PROTONIX) 40 MG tablet Take 1 tablet (40 mg total) by mouth daily. 01/04/19   Almyra Deforest, PA  simvastatin (ZOCOR) 20 MG tablet Take 1 tablet (20 mg total) by mouth at bedtime. 10/24/13   Troy Sine, MD    Family History Family History  Problem Relation Age of Onset   Heart disease Mother    Dementia Mother    Lung cancer  Father    Stroke Maternal Grandmother    Heart disease Maternal Grandmother    Colon cancer Neg Hx    Esophageal cancer Neg Hx    Rectal cancer Neg Hx    Stomach cancer Neg Hx     Social History Social History   Tobacco Use   Smoking status: Never Smoker   Smokeless tobacco: Never Used  Substance Use Topics   Alcohol use: Yes    Comment: wine occ   Drug use: No     Allergies   Crestor [rosuvastatin calcium]   Review of Systems Review of Systems  Constitutional: Negative for chills and fever.  HENT: Negative for congestion, ear discharge, ear pain, sinus pressure, sinus pain and sore throat.   Eyes: Negative for pain and redness.  Respiratory: Negative for cough and shortness of breath.   Cardiovascular: Negative for chest pain.  Gastrointestinal: Positive for abdominal pain. Negative for constipation, diarrhea, nausea and vomiting.  Genitourinary: Negative for dysuria and hematuria.  Musculoskeletal: Negative for back pain and neck pain.  Skin: Negative for wound.  Neurological: Negative for weakness, numbness and headaches.     Physical Exam Updated Vital Signs BP 121/68 (BP Location: Left Arm)    Pulse 90    Temp 100 F (37.8 C) (Oral)    Resp 18    Ht '5\' 3"'$  (1.6 m)    Wt 77.1 kg    SpO2 100%    BMI 30.11 kg/m   Physical Exam Vitals signs and nursing note reviewed.  Constitutional:      General: She is not in acute distress.    Appearance: She is not ill-appearing.  HENT:     Head: Normocephalic and atraumatic.     Right Ear: Tympanic membrane and external ear normal.     Left Ear: Tympanic membrane and external ear normal.     Nose: Nose normal.     Mouth/Throat:     Mouth: Mucous membranes are moist.     Pharynx: Oropharynx is clear.  Eyes:     General: No scleral icterus.       Right eye: No discharge.        Left eye: No discharge.     Extraocular Movements: Extraocular movements intact.     Conjunctiva/sclera: Conjunctivae normal.      Pupils: Pupils are equal, round, and reactive to light.  Neck:     Musculoskeletal: Normal range of motion.     Vascular: No JVD.  Cardiovascular:     Rate and Rhythm: Normal rate and regular rhythm.     Pulses: Normal pulses.          Radial pulses are 2+ on the right side and 2+ on the left side.     Heart sounds: Normal heart sounds.  Pulmonary:     Comments: Lungs clear to auscultation in all fields. Symmetric chest rise. No wheezing, rales, or rhonchi. Abdominal:     General: Bowel sounds are normal.  Tenderness: There is abdominal tenderness. There is no right CVA tenderness, left CVA tenderness, guarding or rebound. Positive signs include Rovsing's sign.     Comments: Abdomen is soft, non-distended.  Tenderness palpation right lower quadrant .no rigidity, no guarding. No peritoneal signs.  Musculoskeletal: Normal range of motion.  Skin:    General: Skin is warm and dry.     Capillary Refill: Capillary refill takes less than 2 seconds.  Neurological:     Mental Status: She is oriented to person, place, and time.     GCS: GCS eye subscore is 4. GCS verbal subscore is 5. GCS motor subscore is 6.     Comments: Fluent speech, no facial droop.  Psychiatric:        Behavior: Behavior normal.      ED Treatments / Results  Labs (all labs ordered are listed, but only abnormal results are displayed) Labs Reviewed  COMPREHENSIVE METABOLIC PANEL - Abnormal; Notable for the following components:      Result Value   Glucose, Bld 103 (*)    All other components within normal limits  CBC WITH DIFFERENTIAL/PLATELET - Abnormal; Notable for the following components:   WBC 11.9 (*)    Neutro Abs 8.7 (*)    Monocytes Absolute 1.1 (*)    All other components within normal limits  URINALYSIS, ROUTINE W REFLEX MICROSCOPIC - Abnormal; Notable for the following components:   APPearance HAZY (*)    All other components within normal limits  LIPASE, BLOOD    EKG None  Radiology Ct  Abdomen Pelvis W Contrast  Result Date: 03/15/2019 CLINICAL DATA:  Abdominal pain EXAM: CT ABDOMEN AND PELVIS WITH CONTRAST TECHNIQUE: Multidetector CT imaging of the abdomen and pelvis was performed using the standard protocol following bolus administration of intravenous contrast. CONTRAST:  170m OMNIPAQUE IOHEXOL 300 MG/ML  SOLN COMPARISON:  CT abdomen pelvis November 05, 2012, CT chest May 02, 2017 FINDINGS: Lower chest: There is mild cardiomegaly. Aortic and mitral calcifications are seen. No hiatal hernia. Bibasilar dependent atelectasis noted. Hepatobiliary: The liver is normal in density without focal abnormality.The main portal vein is patent. No evidence of calcified gallstones, gallbladder wall thickening or biliary dilatation. Pancreas: Coarse calcification again noted within the pancreatic tail as on prior exam. No pancreatic ductal dilatation or surrounding inflammatory changes. Spleen: Normal in size without focal abnormality. Adrenals/Urinary Tract: Both adrenal glands appear normal. There is a small subcentimeter low-density lesions seen in the lower pole the right kidney. The left kidney is unremarkable. No hydronephrosis or perinephric stranding. The bladder is distended and unremarkable. Stomach/Bowel: The stomach, small bowel, are normal in appearance. There is a 8 cm segment of sigmoid colon with scattered colonic diverticula and surrounding mesenteric fat stranding changes. There is diffuse bowel thickening as well. No free air is seen. No adjacent pericolonic fluid collections. Vascular/Lymphatic: There are no enlarged mesenteric, retroperitoneal, or pelvic lymph nodes. Scattered aortic atherosclerotic calcifications are seen without aneurysmal dilatation. Reproductive: The uterus and adnexa are unremarkable. Other: No evidence of abdominal wall mass or hernia. Musculoskeletal: No acute or significant osseous findings. IMPRESSION: Sigmoid colonic diverticulitis.  No pericolonic abscess  or free air Electronically Signed   By: BPrudencio PairM.D.   On: 03/15/2019 21:06    Procedures Procedures (including critical care time)  Medications Ordered in ED Medications  iohexol (OMNIPAQUE) 300 MG/ML solution 100 mL (100 mLs Intravenous Contrast Given 03/15/19 2040)  acetaminophen (TYLENOL) tablet 650 mg (650 mg Oral Given 03/15/19 2054)  amoxicillin-clavulanate (  AUGMENTIN) 875-125 MG per tablet 1 tablet (1 tablet Oral Given 03/15/19 2143)     Initial Impression / Assessment and Plan / ED Course  I have reviewed the triage vital signs and the nursing notes.  Pertinent labs & imaging results that were available during my care of the patient were reviewed by me and considered in my medical decision making (see chart for details).  7-year-old female presents abdominal pain.  She is very well-appearing, no acute distress.  On arrival she has low-grade temp of 100.  She has mild tenderness palpation of right lower quadrant as well as positive Rovsing sign.  Labs markable for leukocytosis of 11.9.  CMP unremarkable, lipase within normal range.  UA without signs of infection.  She declines pain medication at this time.  CT abdomen shows diverticulitis.  She is tolerating p.o. intake while in the ED.  Patient is hemodynamically stable, in NAD, and able to ambulate in the ED. Evaluation does not show pathology that would require ongoing emergent intervention or inpatient treatment. I explained the diagnosis to the patient, discharge home with antibiotics.  Patient is comfortable with above plan and is stable for discharge at this time. All questions were answered prior to disposition. Strict return precautions for returning to the ED were discussed. Encouraged follow up with PCP. Provided patient with a list of clinic resources to use if they do not have a PCP. Findings and plan of care discussed with supervising physician Dr. Tamera Punt.  This note was prepared using Dragon voice recognition software  and may include unintentional dictation errors due to the inherent limitations of voice recognition software.    Final Clinical Impressions(s) / ED Diagnoses   Final diagnoses:  Diverticulitis    ED Discharge Orders         Ordered    amoxicillin-clavulanate (AUGMENTIN) 875-125 MG tablet  Every 12 hours     03/15/19 2159           Cherre Robins, PA-C 03/15/19 2201    Malvin Johns, MD 03/15/19 2329

## 2019-03-15 NOTE — ED Triage Notes (Signed)
Pt c/o lower abd pain x today-denies v/d-was sent from PCP-pt states she was sent to r/o appendicitis-NAD-steady gait

## 2019-03-15 NOTE — ED Notes (Signed)
Attempt IV access unsuccessful x2

## 2019-04-11 ENCOUNTER — Encounter: Payer: Self-pay | Admitting: Cardiovascular Disease

## 2019-04-11 ENCOUNTER — Ambulatory Visit (INDEPENDENT_AMBULATORY_CARE_PROVIDER_SITE_OTHER): Payer: Medicare Other | Admitting: Cardiovascular Disease

## 2019-04-11 ENCOUNTER — Other Ambulatory Visit: Payer: Self-pay

## 2019-04-11 DIAGNOSIS — I351 Nonrheumatic aortic (valve) insufficiency: Secondary | ICD-10-CM

## 2019-04-11 DIAGNOSIS — I10 Essential (primary) hypertension: Secondary | ICD-10-CM

## 2019-04-11 DIAGNOSIS — R079 Chest pain, unspecified: Secondary | ICD-10-CM | POA: Diagnosis not present

## 2019-04-11 DIAGNOSIS — I358 Other nonrheumatic aortic valve disorders: Secondary | ICD-10-CM

## 2019-04-11 DIAGNOSIS — K219 Gastro-esophageal reflux disease without esophagitis: Secondary | ICD-10-CM

## 2019-04-11 DIAGNOSIS — E785 Hyperlipidemia, unspecified: Secondary | ICD-10-CM

## 2019-04-11 NOTE — Patient Instructions (Signed)
Medication Instructions:  The current medical regimen is effective;  continue present plan and medications as directed. Please refer to the Current Medication list given to you today. If you need a refill on your cardiac medications before your next appointment, please call your pharmacy.  Labwork: TSH, FLP, CMET, CBC TODAY HERE IN OUR OFFICE AT [facility]    Take the provided lab slips with you to the lab for your blood draw.   When you have your labs (blood work) drawn today and your tests are completely normal, you will receive your results only by MyChart Message (if you have MyChart) -OR-  A paper copy in the mail.  If you have any lab test that is abnormal or we need to change your treatment, we will call you to review these results.  Follow-Up: You will need a follow up appointment in 12 months.  Please call our office 2 months in advance to schedule this appointment.  You may see Shelva Majestic, MD or one of the following Advanced Practice Providers on your designated Care Team: [place], [place] . Fabian Sharp, PA-C     At [facility], you and your health needs are our priority.  As part of our continuing mission to provide you with exceptional heart care, we have created designated Provider Care Teams.  These Care Teams include your primary Cardiologist (physician) and Advanced Practice Providers (APPs -  Physician Assistants and Nurse Practitioners) who all work together to provide you with the care you need, when you need it.  Thank you for choosing [facility] at [facility]!!

## 2019-04-11 NOTE — Progress Notes (Signed)
Patient ID: [name] [name], female   DOB: 02-02-1941, 78 y.o.   MRN: [id number]    HPI: [name] [name] is a 78 y.o. female who presents to the office for a 21/2 year cardiology evaluation with me.  [name] has a history of hypertension, hyperlipidemia, mild obesity and GERD. An echo Doppler study in 2012 performend after she experienced exertional shortness of while visiting her daughter in [place] revealed normal systolic function with mild mitral annular calcification mild MR, mild TR, and mild aortic valve sclerosis. She has a history of hyperkalemia in the past.   In March 2015 a three-year follow-up echo Doppler study confirmed normal systolic function with an ejection fraction of 60-65%.  She had grade 1 diastolic dysfunction.  There was mild aortic and mitral insufficiency as well as tricuspid insufficiency.  There was mild dilatation of her left atrium.  Laboratory done at [facility] on 07/04/2014 : Glucose was normal at 91.  She normal renal function with a BUN of 12, guarding 0.82.  LFTs were normal.  She is on vitamin D 2000 mg daily and her vitamin D level was normal at 38.5.  Lipid studies were good with a total cholesterol 153, triglycerides 83, HDL 54, LDL 82.  She has had left knee problems.  She has undergone an MRI and has seen Dr. Berenice Primas.  In May 2017 she developed left lower extremity pain radiating to her calf.  She underwent lower extremity venous duplex imaging which was negative for DVT.  There was no reflux.  She denies any chest pain or shortness of breath.  She denies PND or orthopnea.  She denies palpitations.  I last saw her in December 2017 at which time she was on amlodipine 5 mg and hydrochlorothiazide 12.5 mg for hypertension.  She is on simvastatin 20 mg and fish oil 2 g daily for hyperlipidemia.  She takes omeprazole 20 mg for GERD.  She takes 81 mg aspirin.  She has a remote history of kidney stones and has been without recurrence.  Since I last saw her, she has  been seen by Almyra Deforest, PA-C on several occasions.  She had undergone an echo Doppler study June 2018 which revealed normal systolic and diastolic function.  EF was 60 to 65%.  There was mild aortic sclerosis with mild AR and mitral annular calcification.  In August 2019 she had complaints of occasional chest fluttering and her ECG showed frequent PACs.  At that time her amlodipine was discontinued and she was started on metoprolol tartrate.  She was last seen by Almyra Deforest, [place].  Due to development of intermittent left-sided chest pain, she was referred for a [place]an Myoview study.  This was done on Jan 11, 2019 and revealed normal myocardial perfusion.  LV function was hyperdynamic.  Presently, she denies any chest pain.  Her palpitations are controlled.  She remains active.  She will be having her fifth great-grandchild.  She walks at minimum 3 days/week.  She presents for evaluation.   Past Medical History:  Diagnosis Date  . Arthritis   . Chronic knee pain   . Diverticulosis 2008  . GERD (gastroesophageal reflux disease)   . History of stress test 10/2010   which she execised a 7 met workload achieving a peak heart rate of 141, representing 93% of predicted maximum. Scintigraphic images revealed ormal perfusion.  [name] Hx of echocardiogram 10/2010   which revealed normal systolic as well as diastolic function. she did have mild mitral  annular calcification with mild MR and TR. She had mild aortic sclerosis without stenosis.  . Hyperlipidemia   . Hypertension   . Kidney stones   . Kidney stones   . Obesity   . Vitamin D deficiency     Past Surgical History:  Procedure Laterality Date  . CHONDROPLASTY  08/19/2016   Procedure: CHONDROPLASTY patellar femoral joint, medial femoral condyle;  Surgeon: Dorna Leitz, MD;  Location: [facility];  Service: Orthopedics;;  . KIDNEY STONE SURGERY    . KNEE ARTHROSCOPY WITH EXCISION PLICA Left 0/12/3974   Procedure: KNEE ARTHROSCOPY WITH  EXCISION PLICA;  Surgeon: Dorna Leitz, MD;  Location: [facility];  Service: Orthopedics;  Laterality: Left;  . KNEE ARTHROSCOPY WITH MEDIAL MENISECTOMY Left 08/19/2016   Procedure: KNEE ARTHROSCOPY WITH MEDIAL MENISECTOMY;  Surgeon: Dorna Leitz, MD;  Location: [facility];  Service: Orthopedics;  Laterality: Left;  . Orthroscopic Rt Knee      Allergies  Allergen Reactions  . Crestor [Rosuvastatin Calcium] Other (See Comments)    Leg pain    Current Outpatient Medications  Medication Sig Dispense Refill  . aspirin EC 81 MG tablet Take 81 mg by mouth daily.    . Cholecalciferol (VITAMIN D3) 2000 UNITS capsule Take 2,000 Units by mouth daily.    . hydrochlorothiazide (HYDRODIURIL) 25 MG tablet Take 12.5 mg by mouth daily.     . metoprolol tartrate (LOPRESSOR) 25 MG tablet TAKE 1 TABLET (25 MG TOTAL) BY MOUTH 2 (TWO) TIMES DAILY. KEEP OV. 180 tablet 0  . pantoprazole (PROTONIX) 40 MG tablet Take 1 tablet (40 mg total) by mouth daily. 90 tablet 3  . simvastatin (ZOCOR) 20 MG tablet Take 1 tablet (20 mg total) by mouth at bedtime. 30 tablet 6   No current facility-administered medications for this visit.     Social History   Socioeconomic History  . Marital status: Widowed    Spouse name: Not on file  . Number of children: 2  . Years of education: Not on file  . Highest education level: Not on file  Occupational History  . Occupation: Retired  Scientific laboratory technician  . Financial resource strain: Not on file  . Food insecurity    Worry: Not on file    Inability: Not on file  . Transportation needs    Medical: Not on file    Non-medical: Not on file  Tobacco Use  . Smoking status: Never Smoker  . Smokeless tobacco: Never Used  Substance and Sexual Activity  . Alcohol use: Yes    Comment: wine occ  . Drug use: No  . Sexual activity: Not on file  Lifestyle  . Physical activity    Days per week: Not on file    Minutes per session: Not on file  . Stress: Not  on file  Relationships  . Social Herbalist on phone: Not on file    Gets together: Not on file    Attends religious service: Not on file    Active member of club or organization: Not on file    Attends meetings of clubs or organizations: Not on file    Relationship status: Not on file  . Intimate partner violence    Fear of current or ex partner: Not on file    Emotionally abused: Not on file    Physically abused: Not on file    Forced sexual activity: Not on file  Other Topics Concern  .  Not on file  Social History Narrative  . Not on file   Socially she is widowed and has 2 children 4 grandchildren 2 great-grandchildren. There is no tobacco or alcohol history.  Family History  Problem Relation Age of Onset  . Heart disease Mother   . Dementia Mother   . Lung cancer Father   . Stroke Maternal Grandmother   . Heart disease Maternal Grandmother   . Colon cancer Neg Hx   . Esophageal cancer Neg Hx   . Rectal cancer Neg Hx   . Stomach cancer Neg Hx     ROS General: Negative; No fevers, chills, or night sweats;  HEENT: Negative; No changes in vision or hearing, sinus congestion, difficulty swallowing Pulmonary: Negative; No cough, wheezing, shortness of breath, hemoptysis Cardiovascular: Negative; No chest pain, presyncope, syncope, palpitations GI: Positive for GERD, stable; No nausea, vomiting, diarrhea, or abdominal pain GU: Negative; No dysuria, hematuria, or difficulty voiding Musculoskeletal: Positive for left knee discomfort Hematologic/Oncology: Negative; no easy bruising, bleeding Endocrine: Negative; no heat/cold intolerance; no diabetes Neuro: Negative; no changes in balance, headaches Skin: Negative; No rashes or skin lesions Psychiatric: Negative; No behavioral problems, depression Sleep: Negative; No snoring, daytime sleepiness, hypersomnolence, bruxism, restless legs, hypnogognic hallucinations, no cataplexy Other comprehensive 14 point system  review is negative.  PE BP 118/65   Pulse 73   Ht 5' 5"  (1.651 m)   Wt 170 lb (77.1 kg)   SpO2 95%   BMI 28.29 kg/m    Repeat blood pressure by me was 118/64.  Wt Readings from Last 3 Encounters:  04/11/19 170 lb (77.1 kg)  03/15/19 170 lb (77.1 kg)  01/11/19 168 lb (76.2 kg)   General: Alert, oriented, no distress.  Skin: normal turgor, no rashes, warm and dry HEENT: Normocephalic, atraumatic. Pupils equal round and reactive to light; sclera anicteric; extraocular muscles intact;  Nose without nasal septal hypertrophy Mouth/Parynx benign; Mallinpatti scale 2 Neck: No JVD, no carotid bruits; normal carotid upstroke Lungs: clear to ausculatation and percussion; no wheezing or rales Chest wall: without tenderness to palpitation Heart: PMI not displaced, RRR, s1 s2 normal, 5-9/9 systolic murmur along the left sternal border, no diastolic murmur, no rubs, gallops, thrills, or heaves Abdomen: soft, nontender; no hepatosplenomehaly, BS+; abdominal aorta nontender and not dilated by palpation. Back: no CVA tenderness Pulses 2+ Musculoskeletal: full range of motion, normal strength, no joint deformities Extremities: no clubbing cyanosis or edema, Homan's sign negative  Neurologic: grossly nonfocal; Cranial nerves grossly wnl Psychologic: Normal mood and affect  ECG (independently read by me): Normal sinus rhythm at 73 with mild sinus arrhythmia.  Nonspecific T changes in V1 and V2, unchanged.  Normal intervals  December 2017 ECG (independently read by me): Normal sinus rhythm at 82 bpm.  No ectopy.  Normal intervals.  November 2016 ECG (independently read by me): Normal sinus rhythm at 77 bpm.  No significant ST changes.  Normal intervals.  November 2015 ECG (independently read by me): Normal sinus rhythm at 78 bpm.  Nonspecific T-wave abnormalities, not significant change.  QTc interval 446 ms.  September 2014 ECG: Sinus rhythm at 72 beats per minute;  nonspecific T  changes  LABS:  BMP Latest Ref Rng & Units 03/15/2019 03/26/2018 01/25/2017  Glucose 70 - 99 mg/dL 103(H) 103(H) 94  BUN 8 - 23 mg/dL 15 16 14   Creatinine 0.44 - 1.00 mg/dL 0.82 0.74 0.68  Sodium 135 - 145 mmol/L 138 139 140  Potassium 3.5 - 5.1 mmol/L 3.5  3.9 3.2(L)  Chloride 98 - 111 mmol/L 98 101 101  CO2 22 - 32 mmol/L 27 27 28   Calcium 8.9 - 10.3 mg/dL 9.0 9.3 10.0   Hepatic Function Latest Ref Rng & Units 03/15/2019 01/25/2017 11/29/2013  Total Protein 6.5 - 8.1 g/dL 7.1 7.6 7.0  Albumin 3.5 - 5.0 g/dL 3.8 4.4 4.2  AST 15 - 41 U/L 18 21 19   ALT 0 - 44 U/L 13 15 15   Alk Phosphatase 38 - 126 U/L 69 80 78  Total Bilirubin 0.3 - 1.2 mg/dL 1.1 1.1 0.8  Bilirubin, Direct 0.0 - 0.3 mg/dL - - 0.2   CBC Latest Ref Rng & Units 03/15/2019 03/26/2018 01/25/2017  WBC 4.0 - 10.5 K/uL 11.9(H) 9.3 7.8  Hemoglobin 12.0 - 15.0 g/dL 13.1 14.2 15.3(H)  Hematocrit 36.0 - 46.0 % 39.6 43.1 43.7  Platelets 150 - 400 K/uL 162 225 160   Lab Results  Component Value Date   MCV 95.7 03/15/2019   MCV 94.7 03/26/2018   MCV 93.0 01/25/2017   Lab Results  Component Value Date   TSH 1.560 03/20/2018     Lipid Panel     Component Value Date/Time   CHOL 145 11/29/2013 1034   CHOL 181 05/20/2013 0909   TRIG 77 11/29/2013 1034   TRIG 86 05/20/2013 0909   HDL 52 11/29/2013 1034   HDL 49 05/20/2013 0909   CHOLHDL 2.8 11/29/2013 1034   VLDL 15 11/29/2013 1034   LDLCALC 78 11/29/2013 1034   LDLCALC 115 (H) 05/20/2013 0909    RADIOLOGY: No results found.  IMPRESSION: 1. Essential hypertension   2. Chest pain, unspecified type   3. Aortic valve sclerosis   4. Mild aortic insufficiency   5. Hyperlipidemia, unspecified hyperlipidemia type   6. Gastroesophageal reflux disease without esophagitis     ASSESSMENT AND PLAN: Ms. Tibbett is a 78 year old female who has a history of hypertension, hyperlipidemia, mild obesity, and GERD.  Since I last saw her, she had experienced palpitations which led to  changing her amlodipine to metoprolol with improvement.  Her blood pressure today is stable on her current regimen of metoprolol tartrate 25 mg twice a day and hydrochlorothiazide.  There are no signs of edema.  Her palpitations have improved.  ECG shows sinus rhythm in the 70s with mild sinus arrhythmia.  I reviewed her recent [place] study which was done for evaluation of intermittent somewhat atypical chest pain.  This revealed hyperdynamic LV function with normal perfusion on Jan 11, 2019.  She has a 1/6 to 2/6 systolic murmur.  Her most recent echo Doppler study in June 2018 showed normal systolic and diastolic function with aortic sclerosis and mild aortic insufficiency.  I did not appreciate any AR on exam today.  She continues to be on simvastatin for hyperlipidemia.  She has not had recent laboratory relative to her lipid studies.  She was evaluated in the emergency room on March 15, 2019 with abdominal pain which was felt to be due to diverticulitis.  She was on antibiotic therapy for a week with improvement in symptomatology.  Recommending fasting laboratory be obtained including C met, CBC, TSH, and lipid studies.  She exercises and walks at least 3 days/week.  Her GERD is controlled with pantoprazole.  As long as she remains stable, I will see her in 1 year for reevaluation or sooner if problems arise.  Time spent: 25 minutes Troy Sine, MD, [facility]  04/11/2019 9:00 AM

## 2019-04-12 LAB — TSH: TSH: 1.95 u[IU]/mL (ref 0.450–4.500)

## 2019-04-12 LAB — COMPREHENSIVE METABOLIC PANEL
ALT: 8 IU/L (ref 0–32)
AST: 16 IU/L (ref 0–40)
Albumin/Globulin Ratio: 1.5 (ref 1.2–2.2)
Albumin: 4.1 g/dL (ref 3.7–4.7)
Alkaline Phosphatase: 81 IU/L (ref 39–117)
BUN/Creatinine Ratio: 20 (ref 12–28)
BUN: 16 mg/dL (ref 8–27)
Bilirubin Total: 0.6 mg/dL (ref 0.0–1.2)
CO2: 28 mmol/L (ref 20–29)
Calcium: 9.6 mg/dL (ref 8.7–10.3)
Chloride: 100 mmol/L (ref 96–106)
Creatinine, Ser: 0.79 mg/dL (ref 0.57–1.00)
GFR calc Af Amer: 83 mL/min/{1.73_m2} (ref >59)
GFR calc non Af Amer: 72 mL/min/{1.73_m2} (ref >59)
Globulin, Total: 2.7 g/dL (ref 1.5–4.5)
Glucose: 89 mg/dL (ref 65–99)
Potassium: 4.5 mmol/L (ref 3.5–5.2)
Sodium: 143 mmol/L (ref 134–144)
Total Protein: 6.8 g/dL (ref 6.0–8.5)

## 2019-04-12 LAB — CBC
Hematocrit: 41.2 % (ref 34.0–46.6)
Hemoglobin: 13.7 g/dL (ref 11.1–15.9)
MCH: 31.1 pg (ref 26.6–33.0)
MCHC: 33.3 g/dL (ref 31.5–35.7)
MCV: 93 fL (ref 79–97)
Platelets: 178 10*3/uL (ref 150–450)
RBC: 4.41 x10E6/uL (ref 3.77–5.28)
RDW: 11.9 % (ref 11.7–15.4)
WBC: 5.6 10*3/uL (ref 3.4–10.8)

## 2019-04-12 LAB — LIPID PANEL
Chol/HDL Ratio: 2.9 ratio (ref 0.0–4.4)
Cholesterol, Total: 142 mg/dL (ref 100–199)
HDL: 49 mg/dL
LDL Calculated: 67 mg/dL (ref 0–99)
Triglycerides: 128 mg/dL (ref 0–149)
VLDL Cholesterol Cal: 26 mg/dL (ref 5–40)

## 2019-05-26 ENCOUNTER — Other Ambulatory Visit: Payer: Self-pay | Admitting: Cardiovascular Disease

## 2019-11-18 ENCOUNTER — Other Ambulatory Visit: Payer: Self-pay | Admitting: Family Medicine

## 2019-11-18 DIAGNOSIS — Z1231 Encounter for screening mammogram for malignant neoplasm of breast: Secondary | ICD-10-CM

## 2019-11-26 ENCOUNTER — Other Ambulatory Visit: Payer: Self-pay

## 2019-11-26 ENCOUNTER — Ambulatory Visit
Admission: RE | Admit: 2019-11-26 | Discharge: 2019-11-26 | Disposition: A | Discharge status: Home / Self Care | Payer: Medicare Other | Source: Ambulatory Visit | Attending: Family Medicine | Admitting: Family Medicine

## 2019-11-26 DIAGNOSIS — Z1231 Encounter for screening mammogram for malignant neoplasm of breast: Secondary | ICD-10-CM

## 2019-12-23 ENCOUNTER — Other Ambulatory Visit: Payer: Self-pay | Admitting: Physician Assistant

## 2020-02-20 ENCOUNTER — Other Ambulatory Visit: Payer: Self-pay | Admitting: Cardiovascular Disease

## 2020-03-23 ENCOUNTER — Telehealth: Payer: Self-pay | Admitting: Cardiovascular Disease

## 2020-03-23 NOTE — Telephone Encounter (Signed)
Returned call to patient she stated she started having palpitations las week while on vacation.Stated she continues to have palpitations.Stated they have never lasted this long.No chest pain.No sob.Appointment scheduled with Azalee Course PA 8/11 at 10:45 am.

## 2020-03-23 NOTE — Telephone Encounter (Signed)
   STAT if HR is under 50 or over 120 (normal HR is 60-100 beats per minute)  1) What is your heart rate? 75  2) Do you have a log of your heart rate readings (document readings)? BP highest systolic 114 and diastolic 74 HR highest is 75  3) Do you have any other symptoms? Pt said she first felt it while she was out of town, she can feel hr heart beating fast and hard she can feel it on her left side of her chest. She said she doesn't have any other symptoms. No CP, SOB or headache. But she is concern with the palpitations since she can feel it often now.

## 2020-03-25 ENCOUNTER — Encounter: Payer: Self-pay | Admitting: Physician Assistant

## 2020-03-25 ENCOUNTER — Other Ambulatory Visit: Payer: Self-pay

## 2020-03-25 ENCOUNTER — Ambulatory Visit: Payer: Medicare Other | Admitting: Physician Assistant

## 2020-03-25 VITALS — BP 125/75 | HR 66 | Temp 97.0°F | Ht 65.0 in | Wt 173.4 lb

## 2020-03-25 DIAGNOSIS — R002 Palpitations: Secondary | ICD-10-CM

## 2020-03-25 DIAGNOSIS — E785 Hyperlipidemia, unspecified: Secondary | ICD-10-CM | POA: Diagnosis not present

## 2020-03-25 DIAGNOSIS — I10 Essential (primary) hypertension: Secondary | ICD-10-CM | POA: Diagnosis not present

## 2020-03-25 NOTE — Progress Notes (Signed)
Cardiology Office Note:    Date:  03/27/2020   ID:  [name] [name], DOB 09/17/1940, MRN [id number]  PCP:  Orpah Melter, MD  [facility] Cardiologist:  Shelva Majestic, MD  [place] Electrophysiologist:  None   Referring MD: Orpah Melter, MD   Chief Complaint  Patient presents with   Follow-up    seen for Dr. Claiborne Billings.    History of Present Illness:    [name] [name] is a 79 y.o. female with a hx of hypertension, hyperlipidemia, mild obesity and GERD.  Previous echocardiogram performed in 2012 in [place] revealed normal EF, mild mitral annular calcification, mild MR, mild TR, mild aortic sclerosis.  She has a history of hyperkalemia.  Echocardiogram performed in June 2018 showed EF 60 to 65%, mild aortic sclerosis, mild AI.  She was started on metoprolol in 2019 for palpitation associated with PACs.  Myoview obtained in May 2020 was normal, LV function was hyperdynamic.  Patient was last seen by Dr. Claiborne Billings on 04/11/2019 at which time she denied any chest pain.  She was able to play with her great grandchildren 3 times a week without any exertional symptoms.  Patient presents today for evaluation palpitation. Her palpitation appears to occur in the left flank at the level of either 10th or 11th rib.  She typically notices sometimes during the day when she is not exerting herself.  She denies any recent chest pain with exertion or shortness of breath with exertion.  Palpitation only last a few seconds before resolving.  Her palpitation could be cardiac versus muscle spasm.  We initially thought about a 2-week Zio patch monitor, however she informed me she actually has a Kardia mobile device and has been recording at home.  I took a look at her recordings at home which shows all normal rhythm without any PVCs, PACs or any other arrhythmia to explain her symptoms.  I recommended continued observation for the time being as she no longer need a heart monitor.  I did not adjust her  beta-blocker dosage as there is no underlying rhythm issue to explain her symptoms.  It is very possible the symptoms she described is muscle spasm.  She can follow-up in 3 months.  During the meantime, if she has worsening symptoms, she can during the meantime, if she has worsening symptoms, she can send [place] strips from her Jodelle Red mobile device.    Past Medical History:  Diagnosis Date   Arthritis    Chronic knee pain    Diverticulosis 2008   GERD (gastroesophageal reflux disease)    History of stress test 10/2010   which she execised a 7 met workload achieving a peak heart rate of 141, representing 93% of predicted maximum. Scintigraphic images revealed ormal perfusion.   Hx of echocardiogram 10/2010   which revealed normal systolic as well as diastolic function. she did have mild mitral annular calcification with mild MR and TR. She had mild aortic sclerosis without stenosis.   Hyperlipidemia    Hypertension    Kidney stones    Kidney stones    Obesity    Vitamin D deficiency     Past Surgical History:  Procedure Laterality Date   CHONDROPLASTY  08/19/2016   Procedure: CHONDROPLASTY patellar femoral joint, medial femoral condyle;  Surgeon: Dorna Leitz, MD;  Location: [facility];  Service: Orthopedics;;   KIDNEY STONE SURGERY     KNEE ARTHROSCOPY WITH EXCISION PLICA Left 10/20/1694   Procedure: KNEE ARTHROSCOPY WITH EXCISION PLICA;  Surgeon: Dorna Leitz, MD;  Location: [facility];  Service: Orthopedics;  Laterality: Left;   KNEE ARTHROSCOPY WITH MEDIAL MENISECTOMY Left 08/19/2016   Procedure: KNEE ARTHROSCOPY WITH MEDIAL MENISECTOMY;  Surgeon: Dorna Leitz, MD;  Location: [facility];  Service: Orthopedics;  Laterality: Left;   Orthroscopic Rt Knee      Current Medications: Current Meds  Medication Sig   aspirin EC 81 MG tablet Take 81 mg by mouth daily.   Cholecalciferol (VITAMIN D3) 2000 UNITS capsule Take 2,000 Units by  mouth daily.   hydrochlorothiazide (HYDRODIURIL) 25 MG tablet Take 12.5 mg by mouth daily.    metoprolol tartrate (LOPRESSOR) 25 MG tablet TAKE 1 TABLET (25 MG TOTAL) BY MOUTH 2 (TWO) TIMES DAILY. KEEP OV.   pantoprazole (PROTONIX) 40 MG tablet TAKE 1 TABLET BY MOUTH EVERY DAY   simvastatin (ZOCOR) 20 MG tablet Take 1 tablet (20 mg total) by mouth at bedtime.     Allergies:   Crestor [rosuvastatin calcium]   Social History   Socioeconomic History   Marital status: Widowed    Spouse name: Not on file   Number of children: 2   Years of education: Not on file   Highest education level: Not on file  Occupational History   Occupation: Retired  Tobacco Use   Smoking status: Never Smoker   Smokeless tobacco: Never Used  Scientific laboratory technician Use: Never used  Substance and Sexual Activity   Alcohol use: Yes    Comment: wine occ   Drug use: No   Sexual activity: Not on file  Other Topics Concern   Not on file  Social History Narrative   Not on file   Social Determinants of Health   Financial Resource Strain:    Difficulty of Paying Living Expenses:   Food Insecurity:    Worried About Charity fundraiser in the Last Year:    Arboriculturist in the Last Year:   Transportation Needs:    Film/video editor (Medical):    Lack of Transportation (Non-Medical):   Physical Activity:    Days of Exercise per Week:    Minutes of Exercise per Session:   Stress:    Feeling of Stress :   Social Connections:    Frequency of Communication with Friends and Family:    Frequency of Social Gatherings with Friends and Family:    Attends Religious Services:    Active Member of Clubs or Organizations:    Attends Music therapist:    Marital Status:      Family History: The patient's family history includes Dementia in her mother; Heart disease in her maternal grandmother and mother; Lung cancer in her father; Stroke in her maternal grandmother.  There is no history of Colon cancer, Esophageal cancer, Rectal cancer, or Stomach cancer.  ROS:   Please see the history of present illness.     All other systems reviewed and are negative.  EKGs/Labs/Other Studies Reviewed:    The following studies were reviewed today:  Myoview 01/11/2019  Electrically negative for ischemia  Normal perfusion  LVEF 68%  This is a low risk study.   EKG:  EKG is ordered today.  The ekg ordered today demonstrates normal sinus rhythm, no significant ST-T wave changes  Recent Labs: 04/12/2019: ALT 8; BUN 16; Creatinine, Ser 0.79; Hemoglobin 13.7; Platelets 178; Potassium 4.5; Sodium 143; TSH 1.950  Recent Lipid Panel    Component Value Date/Time  CHOL 142 04/12/2019 0813   CHOL 181 05/20/2013 0909   TRIG 128 04/12/2019 0813   TRIG 86 05/20/2013 0909   HDL 49 04/12/2019 0813   HDL 49 05/20/2013 0909   CHOLHDL 2.9 04/12/2019 0813   CHOLHDL 2.8 11/29/2013 1034   VLDL 15 11/29/2013 1034   LDLCALC 67 04/12/2019 0813   LDLCALC 115 (H) 05/20/2013 0909    Physical Exam:    VS:  BP 125/75    Pulse 66    Temp (!) 97 F (36.1 C)    Ht 5' 5"  (1.651 m)    Wt 173 lb 6.4 oz (78.7 kg)    SpO2 93%    BMI 28.86 kg/m     Wt Readings from Last 3 Encounters:  03/25/20 173 lb 6.4 oz (78.7 kg)  04/11/19 170 lb (77.1 kg)  03/15/19 170 lb (77.1 kg)     GEN:  Well nourished, well developed in no acute distress HEENT: Normal NECK: No JVD; No carotid bruits LYMPHATICS: No lymphadenopathy CARDIAC: RRR, no murmurs, rubs, gallops RESPIRATORY:  Clear to auscultation without rales, wheezing or rhonchi  ABDOMEN: Soft, non-tender, non-distended MUSCULOSKELETAL:  No edema; No deformity  SKIN: Warm and dry NEUROLOGIC:  Alert and oriented x 3 PSYCHIATRIC:  Normal affect   ASSESSMENT:    1. Palpitation   2. Essential hypertension   3. Hyperlipidemia LDL goal <100    PLAN:    In order of problems listed above:  1. Palpitation: Location of the symptom is  actually in the left flank near 10th and 11th rib.  I am not sure if this is palpitation or muscle spasm.  She has a Kardia mobile device, I reviewed the strips that was recorded recently.  There has been no arrhythmia during symptomatic episodes.  I recommended continue observation  2. Hypertension: Blood pressure stable  3. Hyperlipidemia: On Zocor   Medication Adjustments/Labs and Tests Ordered: Current medicines are reviewed at length with the patient today.  Concerns regarding medicines are outlined above.  Orders Placed This Encounter  Procedures   EKG 12-Lead   No orders of the defined types were placed in this encounter.   Patient Instructions  Medication Instructions:  Your physician recommends that you continue on your current medications as directed. Please refer to the Current Medication list given to you today.  *If you need a refill on your cardiac medications before your next appointment, please call your pharmacy*  Lab Work: NONE ordered at this time of appointment   If you have labs (blood work) drawn today and your tests are completely normal, you will receive your results only by:  [place] (if you have MyChart) OR  A paper copy in the mail If you have any lab test that is abnormal or we need to change your treatment, we will call you to review the results.  Testing/Procedures: NONE ordered at this time of appointment   Follow-Up: At [facility], you and your health needs are our priority.  As part of our continuing mission to provide you with exceptional heart care, we have created designated Provider Care Teams.  These Care Teams include your primary Cardiologist (physician) and Advanced Practice Providers (APPs -  Physician Assistants and Nurse Practitioners) who all work together to provide you with the care you need, when you need it.  We recommend signing up for the patient portal called "MyChart".  Sign up information is provided on this  After Visit Summary.  MyChart is used to  connect with patients for Virtual Visits (Telemedicine).  Patients are able to view lab/test results, encounter notes, upcoming appointments, etc.  Non-urgent messages can be sent to your provider as well.   To learn more about what you can do with MyChart, go to [email or web address].    Your next appointment:   3 month(s)  The format for your next appointment:   In Person  Provider:   Shelva Majestic, MD  Other Instructions      Signed, Almyra Deforest, [place]  03/27/2020 10:58 PM    [facility]

## 2020-03-25 NOTE — Patient Instructions (Signed)
Medication Instructions:  Your physician recommends that you continue on your current medications as directed. Please refer to the Current Medication list given to you today.  *If you need a refill on your cardiac medications before your next appointment, please call your pharmacy*  Lab Work: NONE ordered at this time of appointment   If you have labs (blood work) drawn today and your tests are completely normal, you will receive your results only by: . MyChart Message (if you have MyChart) OR . A paper copy in the mail If you have any lab test that is abnormal or we need to change your treatment, we will call you to review the results.  Testing/Procedures: NONE ordered at this time of appointment   Follow-Up: At [facility], you and your health needs are our priority.  As part of our continuing mission to provide you with exceptional heart care, we have created designated Provider Care Teams.  These Care Teams include your primary Cardiologist (physician) and Advanced Practice Providers (APPs -  Physician Assistants and Nurse Practitioners) who all work together to provide you with the care you need, when you need it.  We recommend signing up for the patient portal called "MyChart".  Sign up information is provided on this After Visit Summary.  MyChart is used to connect with patients for Virtual Visits (Telemedicine).  Patients are able to view lab/test results, encounter notes, upcoming appointments, etc.  Non-urgent messages can be sent to your provider as well.   To learn more about what you can do with MyChart, go to [email or web address].    Your next appointment:   3 month(s)  The format for your next appointment:   In Person  Provider:   Thomas Kelly, MD  Other Instructions     

## 2020-03-27 ENCOUNTER — Encounter: Payer: Self-pay | Admitting: Physician Assistant

## 2020-03-28 ENCOUNTER — Other Ambulatory Visit: Payer: Self-pay | Admitting: Cardiovascular Disease

## 2020-05-11 ENCOUNTER — Other Ambulatory Visit: Payer: Self-pay | Admitting: Cardiovascular Disease

## 2020-07-01 ENCOUNTER — Ambulatory Visit: Payer: Medicare Other | Admitting: Cardiovascular Disease

## 2020-10-16 ENCOUNTER — Other Ambulatory Visit: Payer: Self-pay

## 2020-10-16 ENCOUNTER — Ambulatory Visit: Payer: Medicare Other | Admitting: Cardiovascular Disease

## 2020-10-16 ENCOUNTER — Encounter: Payer: Self-pay | Admitting: Cardiovascular Disease

## 2020-10-16 VITALS — BP 116/67 | HR 76 | Ht 65.0 in | Wt 168.4 lb

## 2020-10-16 DIAGNOSIS — E785 Hyperlipidemia, unspecified: Secondary | ICD-10-CM

## 2020-10-16 DIAGNOSIS — I10 Essential (primary) hypertension: Secondary | ICD-10-CM | POA: Diagnosis not present

## 2020-10-16 DIAGNOSIS — I351 Nonrheumatic aortic (valve) insufficiency: Secondary | ICD-10-CM

## 2020-10-16 DIAGNOSIS — I358 Other nonrheumatic aortic valve disorders: Secondary | ICD-10-CM | POA: Diagnosis not present

## 2020-10-16 NOTE — Patient Instructions (Signed)
Medication Instructions:  The current medical regimen is effective;  continue present plan and medications.  *If you need a refill on your cardiac medications before your next appointment, please call your pharmacy*   Lab Work: Fasting lab work (CBC, CMET, TSH, LIPID)  If you have labs (blood work) drawn today and your tests are completely normal, you will receive your results only by: [name] MyChart Message (if you have MyChart) OR . A paper copy in the mail If you have any lab test that is abnormal or we need to change your treatment, we will call you to review the results.   Testing/Procedures: Echocardiogram - Your physician has requested that you have an echocardiogram. Echocardiography is a painless test that uses sound waves to create images of your heart. It provides your doctor with information about the size and shape of your heart and how well your heart's chambers and valves are working. This procedure takes approximately one hour. There are no restrictions for this procedure. This will be performed at our [facility] location - [address], [address].    Follow-Up: At [facility], you and your health needs are our priority.  As part of our continuing mission to provide you with exceptional heart care, we have created designated Provider Care Teams.  These Care Teams include your primary Cardiologist (physician) and Advanced Practice Providers (APPs -  Physician Assistants and Nurse Practitioners) who all work together to provide you with the care you need, when you need it.  We recommend signing up for the patient portal called "MyChart".  Sign up information is provided on this After Visit Summary.  MyChart is used to connect with patients for Virtual Visits (Telemedicine).  Patients are able to view lab/test results, encounter notes, upcoming appointments, etc.  Non-urgent messages can be sent to your provider as well.   To learn more about what you can do with MyChart, go to  [email or web address].    Your next appointment:   6 month(s)  The format for your next appointment:   In Person  Provider:   Nicki Guadalajara, MD

## 2020-10-16 NOTE — Progress Notes (Signed)
Patient ID: [name] [name], female   DOB: 08/27/1940, 80 y.o.   MRN: [id number]    HPI: [name] [name] is a 80 y.o. female who presents to the office for an 78 month cardiology evaluation with me.  [name] [name] has a history of hypertension, hyperlipidemia, mild obesity and GERD. An echo Doppler study in 2012 performend after she experienced exertional shortness of while visiting her daughter in [place] revealed normal systolic function with mild mitral annular calcification mild MR, mild TR, and mild aortic valve sclerosis. She has a history of hyperkalemia in the past.   In March 2015 a three-year follow-up echo Doppler study confirmed normal systolic function with an ejection fraction of 60-65%.  She had grade 1 diastolic dysfunction.  There was mild aortic and mitral insufficiency as well as tricuspid insufficiency.  There was mild dilatation of her left atrium.  Laboratory done at [facility] on 07/04/2014 : Glucose was normal at 91.  She normal renal function with a BUN of 12, guarding 0.82.  LFTs were normal.  She is on vitamin D 2000 mg daily and her vitamin D level was normal at 38.5.  Lipid studies were good with a total cholesterol 153, triglycerides 83, HDL 54, LDL 82.  She has had left knee problems.  She has undergone an MRI and has seen Dr. Berenice Primas.  In May 2017 she developed left lower extremity pain radiating to her calf.  She underwent lower extremity venous duplex imaging which was negative for DVT.  There was no reflux.  She denies any chest pain or shortness of breath.  She denies PND or orthopnea.  She denies palpitations.  I last saw her in December 2017 at which time she was on amlodipine 5 mg and hydrochlorothiazide 12.5 mg for hypertension.  She is on simvastatin 20 mg and fish oil 2 g daily for hyperlipidemia.  She takes omeprazole 20 mg for GERD.  She takes 81 mg aspirin.  She has a remote history of kidney stones and has been without recurrence.  She has been seen by Almyra Deforest, PA-C on several occasions.  She had undergone an echo Doppler study June 2018 which revealed normal systolic and diastolic function.  EF was 60 to 65%.  There was mild aortic sclerosis with mild AR and mitral annular calcification.  In August 2019 she had complaints of occasional chest fluttering and her ECG showed frequent PACs.  At that time her amlodipine was discontinued and she was started on metoprolol tartrate.  She was last seen by Almyra Deforest, [place].  Due to development of intermittent left-sided chest pain, she was referred for a Lexiscan Myoview study.  This was done on Jan 11, 2019 and revealed normal myocardial perfusion.  LV function was hyperdynamic.  I last saw her in August 2020 at which time she remained stable without chest pain or palpitations.  She remained active.  She will be having her fifth great grandchild.  She was walking at least 3 days/week and denied any exertional symptomatology.   Since I last saw her, she was evaluated by Almyra Deforest in August 2021 and remained stable.  Presently, she continues to feel well.  She will be turning 80 years old.  She has not had recent laboratory in a year.  She denies chest pain, her palpitations are controlled as is her blood pressure.  She presents for reevaluation.  Past Medical History:  Diagnosis Date  . Arthritis   . Chronic knee pain   .  Diverticulosis 2008  . GERD (gastroesophageal reflux disease)   . History of stress test 10/2010   which she execised a 7 met workload achieving a peak heart rate of 141, representing 93% of predicted maximum. Scintigraphic images revealed ormal perfusion.  [name] Hx of echocardiogram 10/2010   which revealed normal systolic as well as diastolic function. she did have mild mitral annular calcification with mild MR and TR. She had mild aortic sclerosis without stenosis.  . Hyperlipidemia   . Hypertension   . Kidney stones   . Kidney stones   . Obesity   . Vitamin D deficiency     Past  Surgical History:  Procedure Laterality Date  . CHONDROPLASTY  08/19/2016   Procedure: CHONDROPLASTY patellar femoral joint, medial femoral condyle;  Surgeon: Dorna Leitz, MD;  Location: [place];  Service: Orthopedics;;  . KIDNEY STONE SURGERY    . KNEE ARTHROSCOPY WITH EXCISION PLICA Left 09/19/971   Procedure: KNEE ARTHROSCOPY WITH EXCISION PLICA;  Surgeon: Dorna Leitz, MD;  Location: [place];  Service: Orthopedics;  Laterality: Left;  . KNEE ARTHROSCOPY WITH MEDIAL MENISECTOMY Left 08/19/2016   Procedure: KNEE ARTHROSCOPY WITH MEDIAL MENISECTOMY;  Surgeon: Dorna Leitz, MD;  Location: [place];  Service: Orthopedics;  Laterality: Left;  . Orthroscopic Rt Knee      Allergies  Allergen Reactions  . Crestor [Rosuvastatin Calcium] Other (See Comments)    Leg pain    Current Outpatient Medications  Medication Sig Dispense Refill  . aspirin EC 81 MG tablet Take 81 mg by mouth daily.    . Cholecalciferol (VITAMIN D3) 2000 UNITS capsule Take 2,000 Units by mouth daily.    . hydrochlorothiazide (HYDRODIURIL) 25 MG tablet Take 12.5 mg by mouth daily.     . metoprolol tartrate (LOPRESSOR) 25 MG tablet TAKE 1 TABLET (25 MG TOTAL) BY MOUTH 2 (TWO) TIMES DAILY. KEEP OV. 180 tablet 3  . pantoprazole (PROTONIX) 40 MG tablet TAKE 1 TABLET BY MOUTH EVERY DAY 90 tablet 1  . simvastatin (ZOCOR) 20 MG tablet Take 1 tablet (20 mg total) by mouth at bedtime. 30 tablet 6   No current facility-administered medications for this visit.    Social History   Socioeconomic History  . Marital status: Widowed    Spouse name: Not on file  . Number of children: 2  . Years of education: Not on file  . Highest education level: Not on file  Occupational History  . Occupation: Retired  Tobacco Use  . Smoking status: Never Smoker  . Smokeless tobacco: Never Used  Vaping Use  . Vaping Use: Never used  Substance and Sexual Activity  . Alcohol use: Yes     Comment: wine occ  . Drug use: No  . Sexual activity: Not on file  Other Topics Concern  . Not on file  Social History Narrative  . Not on file   Social Determinants of Health   Financial Resource Strain: Not on file  Food Insecurity: Not on file  Transportation Needs: Not on file  Physical Activity: Not on file  Stress: Not on file  Social Connections: Not on file  Intimate Partner Violence: Not on file   Socially she is widowed and has 2 children 4 grandchildren 2 great-grandchildren. There is no tobacco or alcohol history.  Family History  Problem Relation Age of Onset  . Heart disease Mother   . Dementia Mother   . Lung cancer Father   . Stroke Maternal  Grandmother   . Heart disease Maternal Grandmother   . Colon cancer Neg Hx   . Esophageal cancer Neg Hx   . Rectal cancer Neg Hx   . Stomach cancer Neg Hx     ROS General: Negative; No fevers, chills, or night sweats;  HEENT: Negative; No changes in vision or hearing, sinus congestion, difficulty swallowing Pulmonary: Negative; No cough, wheezing, shortness of breath, hemoptysis Cardiovascular: Negative; No chest pain, presyncope, syncope, palpitations GI: Positive for GERD, stable; No nausea, vomiting, diarrhea, or abdominal pain GU: Negative; No dysuria, hematuria, or difficulty voiding Musculoskeletal: Positive for left knee discomfort Hematologic/Oncology: Negative; no easy bruising, bleeding Endocrine: Negative; no heat/cold intolerance; no diabetes Neuro: Negative; no changes in balance, headaches Skin: Negative; No rashes or skin lesions Psychiatric: Negative; No behavioral problems, depression Sleep: Negative; No snoring, daytime sleepiness, hypersomnolence, bruxism, restless legs, hypnogognic hallucinations, no cataplexy Other comprehensive 14 point system review is negative.  PE BP 116/67   Pulse 76   Ht _0  (1.651 m)   Wt 168 lb 6.4 oz (76.4 kg)   SpO2 96%   BMI 28.02 kg/m    Repeat blood  pressure by me was 122/70..  Wt Readings from Last 3 Encounters:  10/16/20 168 lb 6.4 oz (76.4 kg)  03/25/20 173 lb 6.4 oz (78.7 kg)  04/11/19 170 lb (77.1 kg)   General: Alert, oriented, no distress.  Skin: normal turgor, no rashes, warm and dry HEENT: Normocephalic, atraumatic. Pupils equal round and reactive to light; sclera anicteric; extraocular muscles intact;  Nose without nasal septal hypertrophy Mouth/Parynx benign; Mallinpatti scale Neck: No JVD, no carotid bruits; normal carotid upstroke Lungs: clear to ausculatation and percussion; no wheezing or rales Chest wall: without tenderness to palpitation Heart: PMI not displaced, RRR, s1 s2 normal, 2/6 systolic murmur, no diastolic murmur, no rubs, gallops, thrills, or heaves Abdomen: soft, nontender; no hepatosplenomehaly, BS+; abdominal aorta nontender and not dilated by palpation. Back: no CVA tenderness Pulses 2+ Musculoskeletal: full range of motion, normal strength, no joint deformities Extremities: no clubbing cyanosis or edema, Homan's sign negative  Neurologic: grossly nonfocal; Cranial nerves grossly wnl Psychologic: Normal mood and affect   ECG (independently read by me): Normal sinus rhythm at 73 with mild sinus arrhythmia.  Nonspecific T changes in V1 and V2, unchanged.  Normal intervals  December 2017 ECG (independently read by me): Normal sinus rhythm at 82 bpm.  No ectopy.  Normal intervals.  November 2016 ECG (independently read by me): Normal sinus rhythm at 77 bpm.  No significant ST changes.  Normal intervals.  November 2015 ECG (independently read by me): Normal sinus rhythm at 78 bpm.  Nonspecific T-wave abnormalities, not significant change.  QTc interval 446 ms.  September 2014 ECG: Sinus rhythm at 72 beats per minute;  nonspecific T changes  LABS:  BMP Latest Ref Rng & Units 04/12/2019 03/15/2019 03/26/2018  Glucose 65 - 99 mg/dL 89 103(H) 103(H)  BUN 8 - 27 mg/dL _1 Creatinine 0.57 - 1.00  mg/dL 0.79 0.82 0.74  BUN/Creat Ratio 12 - 28 20 - -  Sodium 134 - 144 mmol/L 143 138 139  Potassium 3.5 - 5.2 mmol/L 4.5 3.5 3.9  Chloride 96 - 106 mmol/L 100 98 101  CO2 20 - 29 mmol/L _2 Calcium 8.7 - 10.3 mg/dL 9.6 9.0 9.3   Hepatic Function Latest Ref Rng & Units 04/12/2019 03/15/2019 01/25/2017  Total Protein 6.0 - 8.5 g/dL 6.8 7.1 7.6  Albumin  3.7 - 4.7 g/dL 4.1 3.8 4.4  AST 0 - 40 IU/L _0 ALT 0 - 32 IU/L _1 Alk Phosphatase 39 - 117 IU/L 81 69 80  Total Bilirubin 0.0 - 1.2 mg/dL 0.6 1.1 1.1  Bilirubin, Direct 0.0 - 0.3 mg/dL - - -   CBC Latest Ref Rng & Units 04/12/2019 03/15/2019 03/26/2018  WBC 3.4 - 10.8 x10E3/uL 5.6 11.9(H) 9.3  Hemoglobin 11.1 - 15.9 g/dL 13.7 13.1 14.2  Hematocrit 34.0 - 46.6 % 41.2 39.6 43.1  Platelets 150 - 450 x10E3/uL 178 162 225   Lab Results  Component Value Date   MCV 93 04/12/2019   MCV 95.7 03/15/2019   MCV 94.7 03/26/2018   Lab Results  Component Value Date   TSH 1.950 04/12/2019     Lipid Panel     Component Value Date/Time   CHOL 142 04/12/2019 0813   CHOL 181 05/20/2013 0909   TRIG 128 04/12/2019 0813   TRIG 86 05/20/2013 0909   HDL 49 04/12/2019 0813   HDL 49 05/20/2013 0909   CHOLHDL 2.9 04/12/2019 0813   CHOLHDL 2.8 11/29/2013 1034   VLDL 15 11/29/2013 1034   LDLCALC 67 04/12/2019 0813   LDLCALC 115 (H) 05/20/2013 0909    RADIOLOGY: No results found.  IMPRESSION: 1. Essential hypertension   2. Hyperlipidemia LDL goal <100   3. Aortic valve sclerosis   4. Mild aortic insufficiency     ASSESSMENT AND PLAN: Ms. Bouyer is a very pleasant 80 year old female who will soon be turning 80 years old with several weeks.  She has a history of hypertension, hyperlipidemia, mild obesity, and GERD.  Since I last saw her, she had experienced palpitations which led to changing her amlodipine to metoprolol with improvement.  The last several years, her palpitations have remained stable with metoprolol tartrate  12.5 mg twice a day and she has continued to take HCTZ 12.5 mg daily.  Presently there are no signs of edema.  Her blood pressure is stable.  On physical exam she has a 2/6 systolic murmur in the aortic area which is slightly progressed from her prior evaluation.  Her last echo Doppler study was done 2018 which showed normal systolic function with a mildly thickened and mildly calcified valve with mild MR.  There was no significant stenosis.  I have recommended that she undergo a 4-year follow-up echo Doppler study for further evaluation I have recommended that she undergo a 4-year follow-up echo Doppler study for further evaluation of her aortic valve which may now be mildly stenosed.  ECG today remains stable.  She is not having ectopy.  Intervals are normal.  She continues to be on simvastatin 20 mg for hyperlipidemia.  LDL cholesterol in March 2021 was 82.  She has not had recent laboratory.  I am recommending a complete set of fasting labs including a comprehensive metabolic panel, CBC, TSH and lipid studies.  I will contact her regarding the results of the above studies and will plan to see her in 6 months for follow-up evaluation or sooner as necessary.   Troy Sine, MD, [facility]  10/16/2020 5:33 PM

## 2020-10-20 NOTE — Addendum Note (Signed)
Addended by: Brunetta Genera on: 10/20/2020 04:06 PM   Modules accepted: Orders

## 2020-10-22 DIAGNOSIS — I10 Essential (primary) hypertension: Secondary | ICD-10-CM | POA: Diagnosis not present

## 2020-10-22 LAB — CBC
Hematocrit: 41.9 % (ref 34.0–46.6)
Hemoglobin: 14.1 g/dL (ref 11.1–15.9)
MCH: 31.6 pg (ref 26.6–33.0)
MCHC: 33.7 g/dL (ref 31.5–35.7)
MCV: 94 fL (ref 79–97)
Platelets: 197 10*3/uL (ref 150–450)
RBC: 4.46 x10E6/uL (ref 3.77–5.28)
RDW: 12.6 % (ref 11.7–15.4)
WBC: 5.9 10*3/uL (ref 3.4–10.8)

## 2020-10-22 LAB — COMPREHENSIVE METABOLIC PANEL
ALT: 10 IU/L (ref 0–32)
AST: 16 IU/L (ref 0–40)
Albumin/Globulin Ratio: 1.4 (ref 1.2–2.2)
Albumin: 4.2 g/dL (ref 3.7–4.7)
Alkaline Phosphatase: 83 IU/L (ref 44–121)
BUN/Creatinine Ratio: 18 (ref 12–28)
BUN: 16 mg/dL (ref 8–27)
Bilirubin Total: 0.5 mg/dL (ref 0.0–1.2)
CO2: 26 mmol/L (ref 20–29)
Calcium: 9.7 mg/dL (ref 8.7–10.3)
Chloride: 103 mmol/L (ref 96–106)
Creatinine, Ser: 0.9 mg/dL (ref 0.57–1.00)
Globulin, Total: 2.9 g/dL (ref 1.5–4.5)
Glucose: 97 mg/dL (ref 65–99)
Potassium: 4.4 mmol/L (ref 3.5–5.2)
Sodium: 144 mmol/L (ref 134–144)
Total Protein: 7.1 g/dL (ref 6.0–8.5)
eGFR: 65 mL/min/{1.73_m2} (ref >59)

## 2020-10-22 LAB — LIPID PANEL
Chol/HDL Ratio: 3.6 ratio (ref 0.0–4.4)
Cholesterol, Total: 192 mg/dL (ref 100–199)
HDL: 53 mg/dL (ref >39)
LDL Chol Calc (NIH): 125 mg/dL — ABNORMAL HIGH (ref 0–99)
Triglycerides: 78 mg/dL (ref 0–149)
VLDL Cholesterol Cal: 14 mg/dL (ref 5–40)

## 2020-10-22 LAB — TSH: TSH: 0.979 u[IU]/mL (ref 0.450–4.500)

## 2020-10-30 ENCOUNTER — Other Ambulatory Visit: Payer: Self-pay | Admitting: Family Medicine

## 2020-10-30 DIAGNOSIS — Z1231 Encounter for screening mammogram for malignant neoplasm of breast: Secondary | ICD-10-CM

## 2020-11-06 ENCOUNTER — Other Ambulatory Visit: Payer: Self-pay | Admitting: *Deleted

## 2020-11-06 MED ORDER — SIMVASTATIN 40 MG PO TABS: 40.0000 mg | ORAL_TABLET | Freq: Every day | ORAL | 5 refills | Status: DC

## 2020-11-07 ENCOUNTER — Other Ambulatory Visit: Payer: Self-pay | Admitting: Cardiovascular Disease

## 2020-11-11 ENCOUNTER — Ambulatory Visit (HOSPITAL_COMMUNITY): Payer: Medicare Other | Attending: Cardiovascular Disease

## 2020-11-11 ENCOUNTER — Other Ambulatory Visit: Payer: Self-pay

## 2020-11-11 DIAGNOSIS — I10 Essential (primary) hypertension: Secondary | ICD-10-CM | POA: Insufficient documentation

## 2020-11-11 LAB — ECHOCARDIOGRAM COMPLETE
AR max vel: 1.99 cm2
AV Area VTI: 1.99 cm2
AV Area mean vel: 1.79 cm2
AV Mean grad: 13 mmHg
AV Peak grad: 22.7 mmHg
Ao pk vel: 2.38 m/s
Area-P 1/2: 3.12 cm2
P 1/2 time: 555 msec
S' Lateral: 2.7 cm

## 2020-12-23 ENCOUNTER — Ambulatory Visit
Admission: RE | Admit: 2020-12-23 | Discharge: 2020-12-23 | Disposition: A | Discharge status: Home / Self Care | Payer: Medicare Other | Source: Ambulatory Visit | Attending: Family Medicine | Admitting: Family Medicine

## 2020-12-23 ENCOUNTER — Other Ambulatory Visit: Payer: Self-pay

## 2020-12-23 DIAGNOSIS — Z1231 Encounter for screening mammogram for malignant neoplasm of breast: Secondary | ICD-10-CM | POA: Diagnosis not present

## 2021-04-20 DIAGNOSIS — R0981 Nasal congestion: Secondary | ICD-10-CM | POA: Diagnosis not present

## 2021-04-20 DIAGNOSIS — H1033 Unspecified acute conjunctivitis, bilateral: Secondary | ICD-10-CM | POA: Diagnosis not present

## 2021-04-20 DIAGNOSIS — R509 Fever, unspecified: Secondary | ICD-10-CM | POA: Diagnosis not present

## 2021-04-20 DIAGNOSIS — R059 Cough, unspecified: Secondary | ICD-10-CM | POA: Diagnosis not present

## 2021-04-20 DIAGNOSIS — Z03818 Encounter for observation for suspected exposure to other biological agents ruled out: Secondary | ICD-10-CM | POA: Diagnosis not present

## 2021-04-27 ENCOUNTER — Other Ambulatory Visit: Payer: Self-pay | Admitting: Cardiovascular Disease

## 2021-05-05 DIAGNOSIS — Z23 Encounter for immunization: Secondary | ICD-10-CM | POA: Diagnosis not present

## 2021-08-20 DIAGNOSIS — H2513 Age-related nuclear cataract, bilateral: Secondary | ICD-10-CM | POA: Diagnosis not present

## 2021-11-04 DIAGNOSIS — Z23 Encounter for immunization: Secondary | ICD-10-CM | POA: Diagnosis not present

## 2021-11-04 DIAGNOSIS — I10 Essential (primary) hypertension: Secondary | ICD-10-CM | POA: Diagnosis not present

## 2021-11-04 DIAGNOSIS — E785 Hyperlipidemia, unspecified: Secondary | ICD-10-CM | POA: Diagnosis not present

## 2021-11-04 DIAGNOSIS — Z0000 Encounter for general adult medical examination without abnormal findings: Secondary | ICD-10-CM | POA: Diagnosis not present

## 2021-11-05 ENCOUNTER — Other Ambulatory Visit: Payer: Self-pay | Admitting: Nurse Practitioner

## 2021-11-05 DIAGNOSIS — M8580 Other specified disorders of bone density and structure, unspecified site: Secondary | ICD-10-CM

## 2021-11-22 DIAGNOSIS — S30860A Insect bite (nonvenomous) of lower back and pelvis, initial encounter: Secondary | ICD-10-CM | POA: Diagnosis not present

## 2021-11-22 DIAGNOSIS — W57XXXA Bitten or stung by nonvenomous insect and other nonvenomous arthropods, initial encounter: Secondary | ICD-10-CM | POA: Diagnosis not present

## 2021-11-23 ENCOUNTER — Other Ambulatory Visit: Payer: Self-pay | Admitting: Family Medicine

## 2021-11-23 DIAGNOSIS — Z1231 Encounter for screening mammogram for malignant neoplasm of breast: Secondary | ICD-10-CM

## 2021-12-10 ENCOUNTER — Emergency Department (HOSPITAL_BASED_OUTPATIENT_CLINIC_OR_DEPARTMENT_OTHER)
Admission: EM | Admit: 2021-12-10 | Discharge: 2021-12-10 | Disposition: A | Discharge status: Home / Self Care | Payer: Medicare Other | Attending: Emergency Medicine | Admitting: Emergency Medicine

## 2021-12-10 ENCOUNTER — Emergency Department (HOSPITAL_BASED_OUTPATIENT_CLINIC_OR_DEPARTMENT_OTHER): Payer: Medicare Other

## 2021-12-10 ENCOUNTER — Other Ambulatory Visit: Payer: Self-pay

## 2021-12-10 ENCOUNTER — Encounter (HOSPITAL_BASED_OUTPATIENT_CLINIC_OR_DEPARTMENT_OTHER): Payer: Self-pay

## 2021-12-10 DIAGNOSIS — R0789 Other chest pain: Secondary | ICD-10-CM | POA: Diagnosis not present

## 2021-12-10 DIAGNOSIS — Z7982 Long term (current) use of aspirin: Secondary | ICD-10-CM | POA: Diagnosis not present

## 2021-12-10 DIAGNOSIS — I10 Essential (primary) hypertension: Secondary | ICD-10-CM | POA: Insufficient documentation

## 2021-12-10 DIAGNOSIS — Z79899 Other long term (current) drug therapy: Secondary | ICD-10-CM | POA: Diagnosis not present

## 2021-12-10 DIAGNOSIS — M62838 Other muscle spasm: Secondary | ICD-10-CM | POA: Diagnosis not present

## 2021-12-10 DIAGNOSIS — M62830 Muscle spasm of back: Secondary | ICD-10-CM | POA: Insufficient documentation

## 2021-12-10 DIAGNOSIS — M542 Cervicalgia: Secondary | ICD-10-CM | POA: Diagnosis present

## 2021-12-10 DIAGNOSIS — M7918 Myalgia, other site: Secondary | ICD-10-CM

## 2021-12-10 LAB — CBC
HCT: 39.5 % (ref 36.0–46.0)
Hemoglobin: 13.3 g/dL (ref 12.0–15.0)
MCH: 31.5 pg (ref 26.0–34.0)
MCHC: 33.7 g/dL (ref 30.0–36.0)
MCV: 93.6 fL (ref 80.0–100.0)
Platelets: 144 10*3/uL — ABNORMAL LOW (ref 150–400)
RBC: 4.22 MIL/uL (ref 3.87–5.11)
RDW: 12.9 % (ref 11.5–15.5)
WBC: 7.1 10*3/uL (ref 4.0–10.5)
nRBC: 0 % (ref 0.0–0.2)

## 2021-12-10 LAB — BASIC METABOLIC PANEL
Anion gap: 7 (ref 5–15)
BUN: 17 mg/dL (ref 8–23)
CO2: 29 mmol/L (ref 22–32)
Calcium: 9 mg/dL (ref 8.9–10.3)
Chloride: 104 mmol/L (ref 98–111)
Creatinine, Ser: 0.87 mg/dL (ref 0.44–1.00)
GFR, Estimated: >60 mL/min (ref >60)
Glucose, Bld: 93 mg/dL (ref 70–99)
Potassium: 3.4 mmol/L — ABNORMAL LOW (ref 3.5–5.1)
Sodium: 140 mmol/L (ref 135–145)

## 2021-12-10 LAB — TROPONIN I (HIGH SENSITIVITY)
Troponin I (High Sensitivity): 2 ng/L (ref <18)
Troponin I (High Sensitivity): 3 ng/L (ref <18)

## 2021-12-10 MED ORDER — LIDOCAINE 5 % EX PTCH
1.0000 | MEDICATED_PATCH | CUTANEOUS | Status: DC
  Administered 2021-12-10: 1 via TRANSDERMAL
  Filled 2021-12-10: qty 1

## 2021-12-10 MED ORDER — LIDOCAINE 5 % EX PTCH: 1.0000 | MEDICATED_PATCH | CUTANEOUS | 0 refills | Status: DC

## 2021-12-10 MED ORDER — IBUPROFEN 400 MG PO TABS
600.0000 mg | ORAL_TABLET | Freq: Once | ORAL | Status: AC
  Administered 2021-12-10: 600 mg via ORAL
  Filled 2021-12-10: qty 1

## 2021-12-10 MED ORDER — IBUPROFEN 600 MG PO TABS: 600.0000 mg | ORAL_TABLET | Freq: Four times a day (QID) | ORAL | 0 refills | Status: DC | PRN

## 2021-12-10 NOTE — ED Notes (Signed)
Pt ambulatory to restroom with independent steady gait °

## 2021-12-10 NOTE — ED Notes (Signed)
Pt A&Ox4 ambulatory at d/c with independent steady gait, NAD. Pt verbalized understanding of d/c instructions, prescriptions and follow up care. ?

## 2021-12-10 NOTE — Discharge Instructions (Addendum)
I believe your pain in your neck and your shoulder is coming from a muscle spasm of the trapezius muscle.  Please take Motrin every 6 hours as needed for pain.  Use Lidoderm patches, 1 every 24 hours for pain.  Do light stretching exercises and apply heat as needed. ?

## 2021-12-10 NOTE — ED Triage Notes (Signed)
Patient complains of left neck pain - radiating down left arm and to back. Patient states she then feels left chest discomfort. States she is unable to describe it.  ?Symptoms started this AM. ?

## 2021-12-10 NOTE — ED Provider Notes (Signed)
?[facility] EMERGENCY DEPARTMENT ?Provider Note ? ? ?CSN: [id number] ?Arrival date & time: 12/10/21  1726 ? ?  ? ?History ? ?Chief Complaint  ?Patient presents with  ? Arm Pain  ? chest discomfort  ? ? ?[name] [name] is a 81 y.o. female. ? ?Patient is an 81 year old female with past medical history of aortic valve stenosis, mitral regurg, hypertension, and hyperlipidemia presenting for 2 complaints.  States she has a fluttering feeling in her chest on the left side.  States " it does not feel like palpitations because it is not fast I just feel a fluttering sensation when I am resting".  Denies any chest pain or shortness of breath.  Denies any lower extremity swelling.  In normal sinus rhythm on arrival.  Patient also complains of left-sided neck pain that radiates to the top of the shoulder.  Pain is worse with movement of the neck and shoulder and x2 days.  Denies any recent falls or injuries. ? ?The history is provided by the patient. No language interpreter was used.  ?Arm Pain ?Pertinent negatives include no chest pain, no abdominal pain and no shortness of breath.  ? ?  ? ?Home Medications ?Prior to Admission medications   ?Medication Sig Start Date End Date Taking? Authorizing Provider  ?aspirin EC 81 MG tablet Take 81 mg by mouth daily.    [provider]  ?Cholecalciferol (VITAMIN D3) 2000 UNITS capsule Take 2,000 Units by mouth daily.    [provider]  ?hydrochlorothiazide (HYDRODIURIL) 25 MG tablet Take 12.5 mg by mouth daily.     [provider]  ?metoprolol tartrate (LOPRESSOR) 25 MG tablet TAKE 1 TABLET (25 MG TOTAL) BY MOUTH 2 (TWO) TIMES DAILY. KEEP OV. 04/27/21   Lennette Bihari, MD  ?pantoprazole (PROTONIX) 40 MG tablet TAKE 1 TABLET BY MOUTH EVERY DAY 11/09/20   Lennette Bihari, MD  ?simvastatin (ZOCOR) 40 MG tablet TAKE 1 TABLET BY MOUTH EVERYDAY AT BEDTIME 04/27/21   Lennette Bihari, MD  ?   ? ?Allergies    ?Crestor [rosuvastatin calcium]   ? ?Review of  Systems   ?Review of Systems  ?Constitutional:  Negative for chills and fever.  ?HENT:  Negative for ear pain and sore throat.   ?Eyes:  Negative for pain and visual disturbance.  ?Respiratory:  Negative for cough and shortness of breath.   ?Cardiovascular:  Positive for palpitations. Negative for chest pain.  ?Gastrointestinal:  Negative for abdominal pain and vomiting.  ?Genitourinary:  Negative for dysuria and hematuria.  ?Musculoskeletal:  Positive for neck pain. Negative for arthralgias and back pain.  ?Skin:  Negative for color change and rash.  ?Neurological:  Negative for seizures and syncope.  ?All other systems reviewed and are negative. ? ?Physical Exam ?Updated Vital Signs ?BP 136/75   Pulse 73   Temp 98.8 ?F (37.1 ?C) (Oral)   Ht 5\' 3"  (1.6 m)   Wt 73.9 kg   SpO2 96%   BMI 28.87 kg/m?  ?Physical Exam ?Vitals and nursing note reviewed.  ?Constitutional:   ?   General: She is not in acute distress. ?   Appearance: She is well-developed.  ?HENT:  ?   Head: Normocephalic and atraumatic.  ?Eyes:  ?   Conjunctiva/sclera: Conjunctivae normal.  ?Cardiovascular:  ?   Rate and Rhythm: Normal rate and regular rhythm.  ?   Pulses:     ?     Radial pulses are 2+ on the right side and  2+ on the left side.  ?   Heart sounds: No murmur heard. ?Pulmonary:  ?   Effort: Pulmonary effort is normal. No respiratory distress.  ?   Breath sounds: Normal breath sounds.  ?Abdominal:  ?   Palpations: Abdomen is soft.  ?   Tenderness: There is no abdominal tenderness.  ?Musculoskeletal:     ?   General: No swelling.  ?   Right shoulder: Normal.  ?   Left shoulder: Normal.  ?   Right upper arm: Normal.  ?   Left upper arm: Normal.  ?   Right elbow: Normal.  ?   Left elbow: Normal.  ?   Cervical back: Neck supple. Tenderness present. No bony tenderness.  ?   Thoracic back: Normal.  ?   Lumbar back: Normal.  ?     Back: ? ?Skin: ?   General: Skin is warm and dry.  ?   Capillary Refill: Capillary refill takes less than 2  seconds.  ?Neurological:  ?   Mental Status: She is alert.  ?Psychiatric:     ?   Mood and Affect: Mood normal.  ? ? ?ED Results / Procedures / Treatments   ?Labs ?(all labs ordered are listed, but only abnormal results are displayed) ?Labs Reviewed  ?CBC - Abnormal; Notable for the following components:  ?    Result Value  ? Platelets 144 (*)   ? All other components within normal limits  ?BASIC METABOLIC PANEL  ?TROPONIN I (HIGH SENSITIVITY)  ? ? ?EKG ?EKG Interpretation ? ?Date/Time:  Friday December 10 2021 17:40:32 EDT ?Ventricular Rate:  74 ?PR Interval:  130 ?QRS Duration: 78 ?QT Interval:  400 ?QTC Calculation: 444 ?R Axis:   60 ?Text Interpretation: Normal sinus rhythm Low voltage QRS Borderline ECG When compared with ECG of 26-Mar-2018 21:49, PREVIOUS ECG IS PRESENT Confirmed by Edwin DadaGray, Orlin Kann ([id number]) on 12/10/2021 6:29:46 PM ? ?Radiology ?No results found. ? ?Procedures ?Procedures  ? ? ?Medications Ordered in ED ?Medications  ?ibuprofen (ADVIL) tablet 600 mg (has no administration in time range)  ?lidocaine (LIDODERM) 5 % 1 patch (has no administration in time range)  ? ? ?ED Course/ Medical Decision Making/ A&P ?  ?                        ?Medical Decision Making ?Amount and/or Complexity of Data Reviewed ?Labs: ordered. ?Radiology: ordered. ? ?Risk ?Prescription drug management. ? ? ?346:6129 PM ?81 year old female with past medical history of aortic valve stenosis, mitral regurg, hypertension, and hyperlipidemia presenting for fluttering sensation in chest and left-sided neck and shoulder pain. ? ?Patient is alert and oriented x3, no acute distress, afebrile, stable vital signs. The patient's chest pain is not suggestive of pulmonary embolus, cardiac ischemia, aortic dissection, pericarditis, myocarditis, pulmonary embolism, pneumothorax, pneumonia, Zoster, or esophageal perforation, or other serious etiology.  Historically not abrupt in onset, tearing or ripping, pulses symmetric. EKG nonspecific for  ischemia/infarction. No dysrhythmias, brugada, WPW, prolonged QT noted. CXR reviewed and WNL. Troponin negative x2. CXR reviewed. Labs without demonstration of acute pathology unless otherwise noted above.   ? ?I believe neck and shoulder pain to be musculoskeletal in nature.  Patient has tenderness palpation trapezius muscle.  Motrin and Lidoderm given in ED with.  Motrin and Robaxin sent to pharmacy. ? ?Patient in no distress and overall condition improved here in the ED. Detailed discussions were had with the patient regarding current findings, and need for close  f/u with PCP or on call doctor. The patient has been instructed to return immediately if the symptoms worsen in any way for re-evaluation. Patient verbalized understanding and is in agreement with current care plan. All questions answered prior to discharge.  ? ? ? ? ? ? ? ?Final Clinical Impression(s) / ED Diagnoses ?Final diagnoses:  ?Musculoskeletal pain  ?Trapezius muscle spasm  ? ? ?Rx / DC Orders ?ED Discharge Orders   ? ? None  ? ?  ? ? ?  ?Franne Forts, DO ?12/10/21 2010 ? ?

## 2021-12-24 ENCOUNTER — Ambulatory Visit: Payer: Medicare Other

## 2021-12-28 ENCOUNTER — Ambulatory Visit
Admission: RE | Admit: 2021-12-28 | Discharge: 2021-12-28 | Disposition: A | Discharge status: Home / Self Care | Payer: Medicare Other | Source: Ambulatory Visit | Attending: Family Medicine | Admitting: Family Medicine

## 2021-12-28 DIAGNOSIS — Z1231 Encounter for screening mammogram for malignant neoplasm of breast: Secondary | ICD-10-CM | POA: Diagnosis not present

## 2022-01-05 ENCOUNTER — Encounter: Payer: Self-pay | Admitting: Cardiovascular Disease

## 2022-01-05 ENCOUNTER — Ambulatory Visit: Payer: Medicare Other | Admitting: Cardiovascular Disease

## 2022-01-05 VITALS — BP 108/78 | HR 70 | Ht 65.0 in | Wt 162.0 lb

## 2022-01-05 DIAGNOSIS — I10 Essential (primary) hypertension: Secondary | ICD-10-CM | POA: Diagnosis not present

## 2022-01-05 DIAGNOSIS — I77810 Thoracic aortic ectasia: Secondary | ICD-10-CM

## 2022-01-05 DIAGNOSIS — R002 Palpitations: Secondary | ICD-10-CM | POA: Diagnosis not present

## 2022-01-05 DIAGNOSIS — Z79899 Other long term (current) drug therapy: Secondary | ICD-10-CM

## 2022-01-05 DIAGNOSIS — I350 Nonrheumatic aortic (valve) stenosis: Secondary | ICD-10-CM

## 2022-01-05 LAB — BASIC METABOLIC PANEL
BUN/Creatinine Ratio: 16 (ref 12–28)
BUN: 16 mg/dL (ref 8–27)
CO2: 31 mmol/L — ABNORMAL HIGH (ref 20–29)
Calcium: 9.2 mg/dL (ref 8.7–10.3)
Chloride: 100 mmol/L (ref 96–106)
Creatinine, Ser: 1.01 mg/dL — ABNORMAL HIGH (ref 0.57–1.00)
Glucose: 102 mg/dL — ABNORMAL HIGH (ref 70–99)
Potassium: 4 mmol/L (ref 3.5–5.2)
Sodium: 141 mmol/L (ref 134–144)
eGFR: 56 mL/min/{1.73_m2} — ABNORMAL LOW (ref >59)

## 2022-01-05 NOTE — Patient Instructions (Signed)
Medication Instructions:  The current medical regimen is effective;  continue present plan and medications as directed. Please refer to the Current Medication list given to you today.   *If you need a refill on your cardiac medications before your next appointment, please call your pharmacy*  Lab Work:    BMET TODAY     If you have labs (blood work) drawn today and your tests are completely normal, you will receive your results only by: [place] (if you have MyChart) OR  A paper copy in the mail If you have any lab test that is abnormal or we need to change your treatment, we will call you to review the results.  Testing/Procedures:  Echocardiogram (FEB 2024)- Your physician has requested that you have an echocardiogram. Echocardiography is a painless test that uses sound waves to create images of your heart. It provides your doctor with information about the size and shape of your heart and how well your heart's chambers and valves are working. This procedure takes approximately one hour. There are no restrictions for this procedure.   Follow-Up: Your next appointment:  MARCH  2024  In Person with Shelva Majestic, MD    Please call our office 2 months in advance (around [place] 2024)to schedule this appointment   At [facility], you and your health needs are our priority.  As part of our continuing mission to provide you with exceptional heart care, we have created designated Provider Care Teams.  These Care Teams include your primary Cardiologist (physician) and Advanced Practice Providers (APPs -  Physician Assistants and Nurse Practitioners) who all work together to provide you with the care you need, when you need it.    Important Information About Sugar

## 2022-01-05 NOTE — Progress Notes (Signed)
Patient ID: [name] [name], female   DOB: 1940/11/01, 81 y.o.   MRN: [id number]      HPI: [name] [name] is a 81 y.o. female who presents to the office for an  51 month cardiology evaluation.     [name] has a history of hypertension, hyperlipidemia, mild obesity and GERD. An echo Doppler study in 2012 performend after she experienced exertional shortness of while visiting her daughter in [place] revealed normal systolic function with mild mitral annular calcification mild MR, mild TR, and mild aortic valve sclerosis. She has a history of hyperkalemia in the past.   In March 2015 a three-year follow-up echo Doppler study confirmed normal systolic function with an ejection fraction of 60-65%.  She had grade 1 diastolic dysfunction.  There was mild aortic and mitral insufficiency as well as tricuspid insufficiency.  There was mild dilatation of her left atrium.  Laboratory done at [facility] on 07/04/2014 : Glucose was normal at 91.  She normal renal function with a BUN of 12, guarding 0.82.  LFTs were normal.  She is on vitamin D 2000 mg daily and her vitamin D level was normal at 38.5.  Lipid studies were good with a total cholesterol 153, triglycerides 83, HDL 54, LDL 82.  She has had left knee problems.  She has undergone an MRI and has seen Dr. Berenice Primas.  In May 2017 she developed left lower extremity pain radiating to her calf.  She underwent lower extremity venous duplex imaging which was negative for DVT.  There was no reflux.  She denies any chest pain or shortness of breath.  She denies PND or orthopnea.  She denies palpitations.  I last saw her in December 2017 at which time she was on amlodipine 5 mg and hydrochlorothiazide 12.5 mg for hypertension.  She is on simvastatin 20 mg and fish oil 2 g daily for hyperlipidemia.  She takes omeprazole 20 mg for GERD.  She takes 81 mg aspirin.  She has a remote history of kidney stones and has been without recurrence.  She has been seen by Almyra Deforest, PA-C on several occasions.  She had undergone an echo Doppler study June 2018 which revealed normal systolic and diastolic function.  EF was 60 to 65%.  There was mild aortic sclerosis with mild AR and mitral annular calcification.  In August 2019 she had complaints of occasional chest fluttering and her ECG showed frequent PACs.  At that time her amlodipine was discontinued and she was started on metoprolol tartrate.  She was last seen by Almyra Deforest, [place].  Due to development of intermittent left-sided chest pain, she was referred for a Lexiscan Myoview study.  This was done on Jan 11, 2019 and revealed normal myocardial perfusion.  LV function was hyperdynamic.  I saw her in August 2020 at which time she remained stable without chest pain or palpitations.  She remained active.  She will be having her fifth great grandchild.  She was walking at least 3 days/week and denied any exertional symptomatology.   She was evaluated by Almyra Deforest in August 2021 and remained stable.  I last saw her on October 16, 2020.  At that time she remained stable and was turning 80 at the end of the month.  Her blood pressure was stable and she was not having any palpitations.  I recommended she undergo a follow-up echo Doppler study for reassessment of her aortic stenosis and this was done on November 11, 2020.  LV function remains normal at 60 to 65% EF and she had grade 2 diastolic dysfunction.  She continued to have mild aortic valve stenosis with a mean gradient of 13 mm, and peak gradient of 22.7 mm with an estimated aortic valve area of 1.99 cm.  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    Since I last saw her, over the past year she has remained stable.  She apparently had developed some musculoskeletal neck shoulder discomfort which led to an ER evaluation at [facility] on December 10, 2021.  She was felt to have musculoskeletal etiology to her discomfort.  Troponins were negative.  Laboratory  was normal with the exception of low potassium at 3.4.  Presently, she feels well.  Her neck shoulder discomfort has resolved.  She is unaware of palpitations.  She continues to be on HCTZ 12.5 mg and metoprolol tartrate 25 mg twice a day for blood pressure.  She has not had recent palpitations.  She is on pantoprazole for GERD.  She takes simvastatin 20 mg for hyperlipidemia.  Lipid studies done by her primary physician on November 04 2021 showed total cholesterol 141, HDL 56, LDL 71 and triglycerides 70.  She presents for reevaluation.   Past Medical History:  Diagnosis Date   Arthritis    Chronic knee pain    Diverticulosis 2008   GERD (gastroesophageal reflux disease)    History of stress test 10/2010   which she execised a 7 met workload achieving a peak heart rate of 141, representing 93% of predicted maximum. Scintigraphic images revealed ormal perfusion.   Hx of echocardiogram 10/2010   which revealed normal systolic as well as diastolic function. she did have mild mitral annular calcification with mild MR and TR. She had mild aortic sclerosis without stenosis.   Hyperlipidemia    Hypertension    Kidney stones    Kidney stones    Obesity    Vitamin D deficiency     Past Surgical History:  Procedure Laterality Date   CHONDROPLASTY  08/19/2016   Procedure: CHONDROPLASTY patellar femoral joint, medial femoral condyle;  Surgeon: Dorna Leitz, MD;  Location: [place];  Service: Orthopedics;;   KIDNEY STONE SURGERY     KNEE ARTHROSCOPY WITH EXCISION PLICA Left 11/16/9673   Procedure: KNEE ARTHROSCOPY WITH EXCISION PLICA;  Surgeon: Dorna Leitz, MD;  Location: [place];  Service: Orthopedics;  Laterality: Left;   KNEE ARTHROSCOPY WITH MEDIAL MENISECTOMY Left 08/19/2016   Procedure: KNEE ARTHROSCOPY WITH MEDIAL MENISECTOMY;  Surgeon: Dorna Leitz, MD;  Location: [place];  Service: Orthopedics;  Laterality: Left;   Orthroscopic Rt Knee       Allergies  Allergen Reactions   Crestor [Rosuvastatin Calcium] Other (See Comments)    Leg pain    Current Outpatient Medications  Medication Sig Dispense Refill   aspirin EC 81 MG tablet Take 81 mg by mouth daily.     Cholecalciferol (VITAMIN D3) 2000 UNITS capsule Take 2,000 Units by mouth daily.     hydrochlorothiazide (HYDRODIURIL) 25 MG tablet Take 12.5 mg by mouth daily.      metoprolol tartrate (LOPRESSOR) 25 MG tablet TAKE 1 TABLET (25 MG TOTAL) BY MOUTH 2 (TWO) TIMES DAILY. KEEP OV. 180 tablet 1   pantoprazole (PROTONIX) 40 MG tablet TAKE 1 TABLET BY MOUTH EVERY DAY 90 tablet 3   simvastatin (ZOCOR) 20 MG tablet Take 20 mg by mouth daily.  No current facility-administered medications for this visit.    Social History   Socioeconomic History   Marital status: Widowed    Spouse name: Not on file   Number of children: 2   Years of education: Not on file   Highest education level: Not on file  Occupational History   Occupation: Retired  Tobacco Use   Smoking status: Never   Smokeless tobacco: Never  Vaping Use   Vaping Use: Never used  Substance and Sexual Activity   Alcohol use: Yes    Comment: wine occ   Drug use: No   Sexual activity: Not on file  Other Topics Concern   Not on file  Social History Narrative   Not on file   Social Determinants of Health   Financial Resource Strain: Not on file  Food Insecurity: Not on file  Transportation Needs: Not on file  Physical Activity: Not on file  Stress: Not on file  Social Connections: Not on file  Intimate Partner Violence: Not on file   Socially she is widowed and has 2 children 4 grandchildren 2 great-grandchildren. There is no tobacco or alcohol history.  Family History  Problem Relation Age of Onset   Heart disease Mother    Dementia Mother    Lung cancer Father    Stroke Maternal Grandmother    Heart disease Maternal Grandmother    Colon cancer Neg Hx    Esophageal cancer Neg Hx     Rectal cancer Neg Hx    Stomach cancer Neg Hx     ROS General: Negative; No fevers, chills, or night sweats;  HEENT: Negative; No changes in vision or hearing, sinus congestion, difficulty swallowing Pulmonary: Negative; No cough, wheezing, shortness of breath, hemoptysis Cardiovascular: See HPI GI: Positive for GERD, stable; No nausea, vomiting, diarrhea, or abdominal pain GU: Negative; No dysuria, hematuria, or difficulty voiding Musculoskeletal: Positive for left knee discomfort Hematologic/Oncology: Negative; no easy bruising, bleeding Endocrine: Negative; no heat/cold intolerance; no diabetes Neuro: Negative; no changes in balance, headaches Skin: Negative; No rashes or skin lesions Psychiatric: Negative; No behavioral problems, depression Sleep: Negative; No snoring, daytime sleepiness, hypersomnolence, bruxism, restless legs, hypnogognic hallucinations, no cataplexy Other comprehensive 14 point system review is negative.  PE BP 108/78   Pulse 70   Ht 5' 5"  (1.651 m)   Wt 162 lb (73.5 kg)   SpO2 97%   BMI 26.96 kg/m    Repeat blood pressure by me was 110/70  Wt Readings from Last 3 Encounters:  01/05/22 162 lb (73.5 kg)  12/10/21 163 lb (73.9 kg)  10/16/20 168 lb 6.4 oz (76.4 kg)   General: Alert, oriented, no distress.  Skin: normal turgor, no rashes, warm and dry HEENT: Normocephalic, atraumatic. Pupils equal round and reactive to light; sclera anicteric; extraocular muscles intact; Fundi ** Nose without nasal septal hypertrophy Mouth/Parynx benign; Mallinpatti scale 3 Neck: No JVD, no carotid bruits; normal carotid upstroke Lungs: clear to ausculatation and percussion; no wheezing or rales Chest wall: without tenderness to palpitation Heart: PMI not displaced, RRR, s1 s2 normal, 2/6 mid peaking systolic murmur consistent with her aortic stenosis, no diastolic murmur, no rubs, gallops, thrills, or heaves Abdomen: soft, nontender; no hepatosplenomehaly, BS+;  abdominal aorta nontender and not dilated by palpation. Back: no CVA tenderness Pulses 2+ Musculoskeletal: full range of motion, normal strength, no joint deformities Extremities: no clubbing cyanosis or edema, Homan's sign negative  Neurologic: grossly nonfocal; Cranial nerves grossly wnl Psychologic: Normal mood and affect  Jan 05, 2022 ECG (independently read by me): Normal sinus rhythm at 70 bpm.  October 16, 2020 ECG (independently read by me): Normal sinus rhythm at 73 with mild sinus arrhythmia.  Nonspecific T changes in V1 and V2, unchanged.  Normal intervals  December 2017 ECG (independently read by me): Normal sinus rhythm at 82 bpm.  No ectopy.  Normal intervals.  November 2016 ECG (independently read by me): Normal sinus rhythm at 77 bpm.  No significant ST changes.  Normal intervals.  November 2015 ECG (independently read by me): Normal sinus rhythm at 78 bpm.  Nonspecific T-wave abnormalities, not significant change.  QTc interval 446 ms.  September 2014 ECG: Sinus rhythm at 72 beats per minute;  nonspecific T changes  LABS:     Latest Ref Rng & Units 12/10/2021    6:00 PM 10/22/2020    8:54 AM 04/12/2019    8:13 AM  BMP  Glucose 70 - 99 mg/dL 93   97   89    BUN 8 - 23 mg/dL 17   16   16     Creatinine 0.44 - 1.00 mg/dL 0.87   0.90   0.79    BUN/Creat Ratio 12 - 28  18   20     Sodium 135 - 145 mmol/L 140   144   143    Potassium 3.5 - 5.1 mmol/L 3.4   4.4   4.5    Chloride 98 - 111 mmol/L 104   103   100    CO2 22 - 32 mmol/L 29   26   28     Calcium 8.9 - 10.3 mg/dL 9.0   9.7   9.6        Latest Ref Rng & Units 10/22/2020    8:54 AM 04/12/2019    8:13 AM 03/15/2019    7:23 PM  Hepatic Function  Total Protein 6.0 - 8.5 g/dL 7.1   6.8   7.1    Albumin 3.7 - 4.7 g/dL 4.2   4.1   3.8    AST 0 - 40 IU/L 16   16   18     ALT 0 - 32 IU/L 10   8   13     Alk Phosphatase 44 - 121 IU/L 83   81   69    Total Bilirubin 0.0 - 1.2 mg/dL 0.5   0.6   1.1        Latest Ref Rng  & Units 12/10/2021    6:00 PM 10/22/2020    8:54 AM 04/12/2019    8:13 AM  CBC  WBC 4.0 - 10.5 K/uL 7.1   5.9   5.6    Hemoglobin 12.0 - 15.0 g/dL 13.3   14.1   13.7    Hematocrit 36.0 - 46.0 % 39.5   41.9   41.2    Platelets 150 - 400 K/uL 144   197   178     Lab Results  Component Value Date   MCV 93.6 12/10/2021   MCV 94 10/22/2020   MCV 93 04/12/2019   Lab Results  Component Value Date   TSH 0.979 10/22/2020     Lipid Panel     Component Value Date/Time   CHOL 192 10/22/2020 0854   CHOL 181 05/20/2013 0909   TRIG 78 10/22/2020 0854   TRIG 86 05/20/2013 0909   HDL 53 10/22/2020 0854   HDL 49 05/20/2013 0909   CHOLHDL 3.6 10/22/2020 0854   CHOLHDL  2.8 11/29/2013 1034   VLDL 15 11/29/2013 1034   LDLCALC 125 (H) 10/22/2020 0854   LDLCALC 115 (H) 05/20/2013 2111    RADIOLOGY: No results found.  IMPRESSION:  1. Nonrheumatic aortic valve stenosis   2. Essential hypertension   3. Palpitation   4. Ascending aorta dilation (HCC)   5. Medication management     ASSESSMENT AND PLAN: Ms. Laatsch is a very pleasant 14 -year-old female who has a history of hypertension, hyperlipidemia, mild obesity, and GERD.  Remotely, she had experienced palpitations with led to changing her amlodipine to metoprolol with resolution.  She has documented mild aortic stenosis and her most recent echo from November 12, 2021 continue to show mild AS with excellent LV function with EF at 60 to 65%, a mean gradient of 13 and peak gradient of almost 23 mmHg.  She recently had experienced some left shoulder and neck musculoskeletal discomfort which has resolved.  Her blood pressure today is stable on her current regimen of low-dose HCTZ 12.5 mg and metoprolol.  At her recent ER evaluation potassium was 3.4.  I have suggested a follow-up in that today for reassessment of her prior hypokalemia and treatment may be necessary if further reduced.  Clinically she remains asymptomatic.  She denies chest pain.   There is no presyncope or syncope or exertional dyspnea.  She is monitored by Dr. Christella Noa.  Lipid studies on low-dose simvastatin are excellent with LDL at 71 total cholesterol 141 triglycerides 70 on November 04, 2021.  I will contact her regarding her chemistry result.  I have recommended she undergo a 2-year follow-up echo Doppler study in February/March 2024 and I will see her in the office for follow-up evaluation at that time.   Troy Sine, MD, [facility]  01/05/2022 9:16 AM

## 2022-02-17 IMAGING — MG MM DIGITAL SCREENING BILAT W/ TOMO AND CAD
8 series · 8 of 24 positions shown · non-contrast
Comparison: Previous exam(s).

CLINICAL DATA: Screening.

EXAM:
DIGITAL SCREENING BILATERAL MAMMOGRAM WITH TOMOSYNTHESIS AND CAD
TECHNIQUE: Bilateral screening digital craniocaudal and mediolateral oblique
mammograms were obtained. Bilateral screening digital breast
tomosynthesis was performed. The images were evaluated with
computer-aided detection.

[R CC synth-2D]
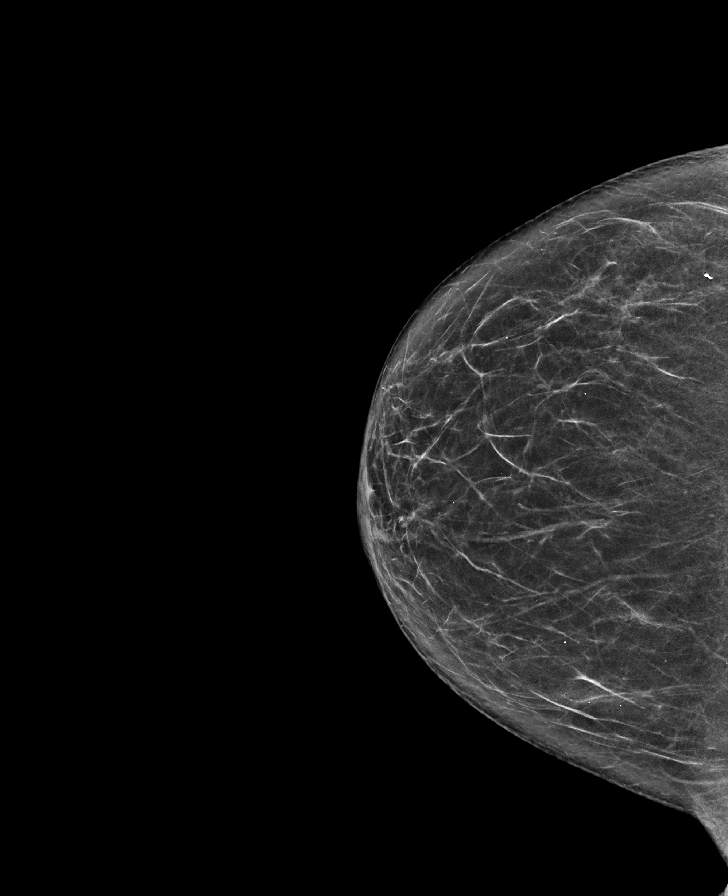

[R MLO synth-2D]
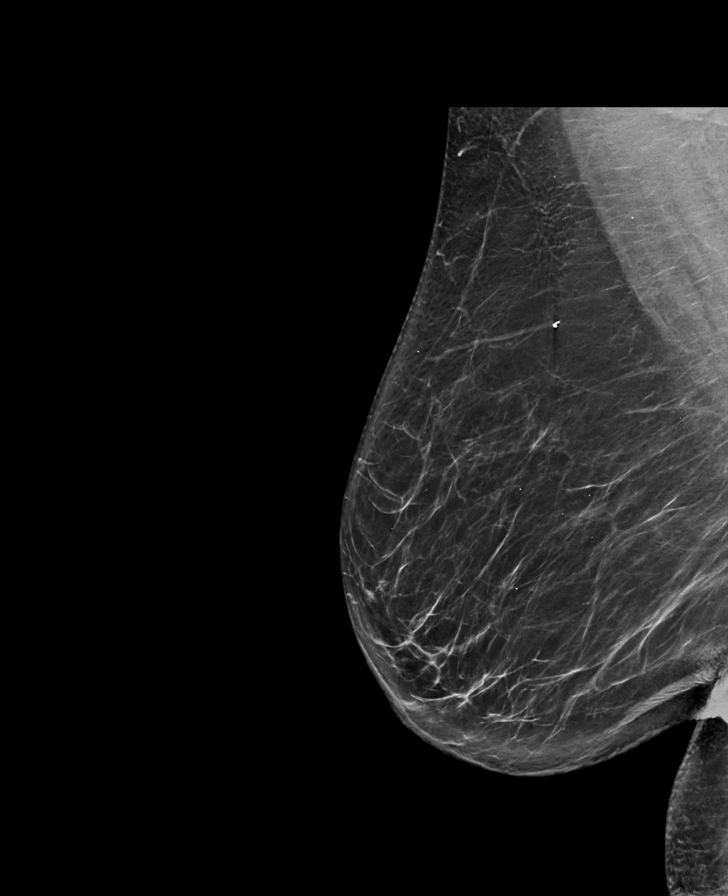

[L MLO synth-2D]
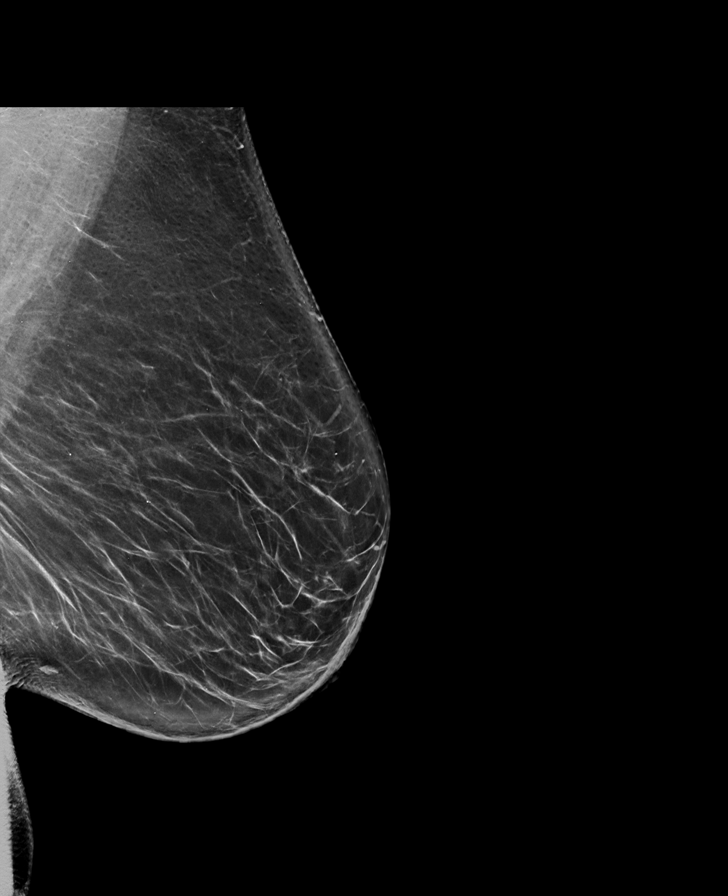

[L CC synth-2D]
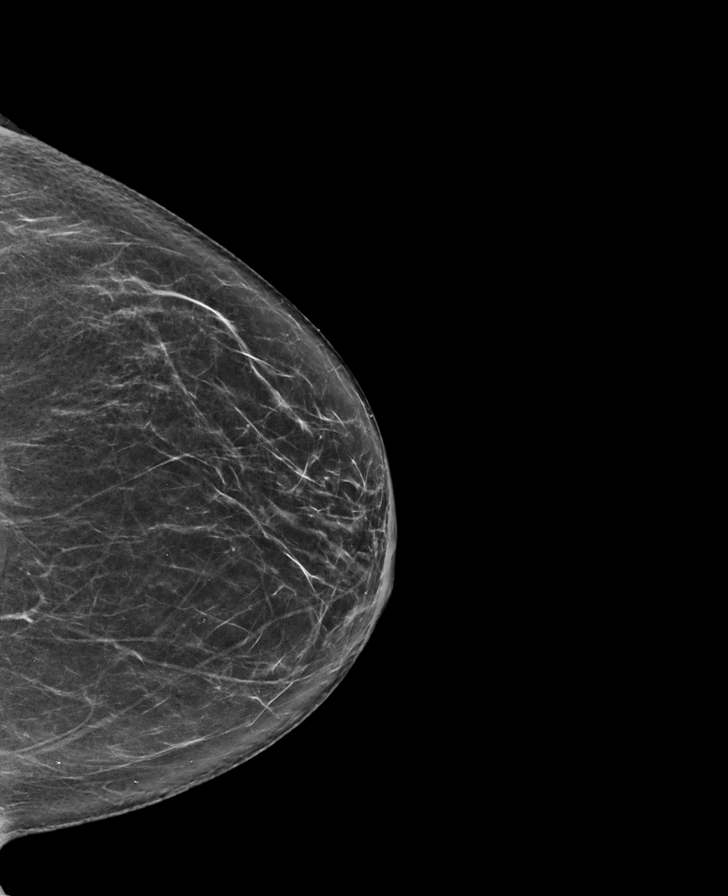

[L MLO tomo · tomo slice 42/83.0]
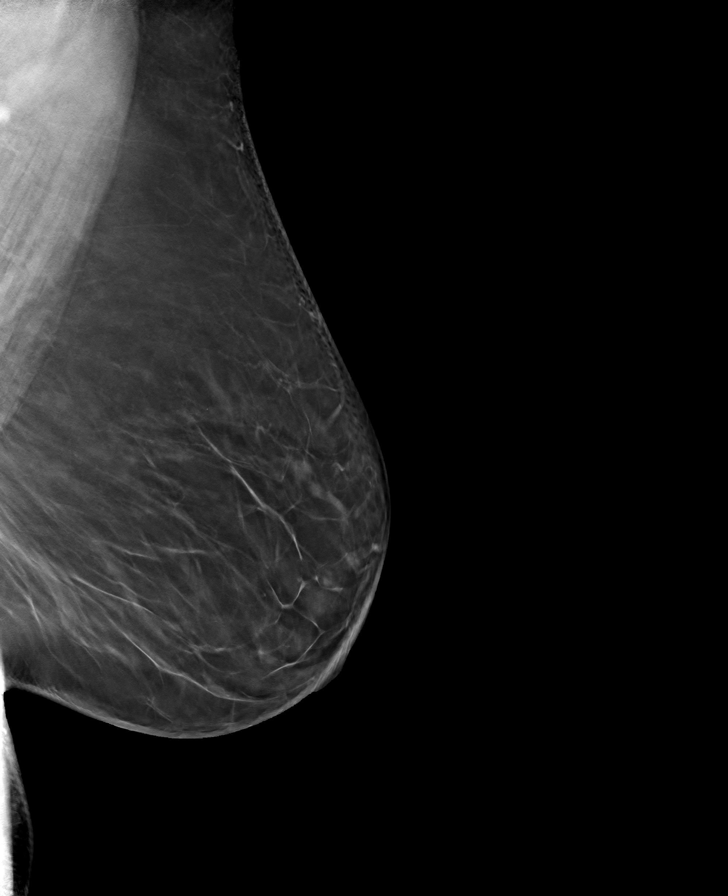

[R MLO tomo · tomo slice 41/81.0]
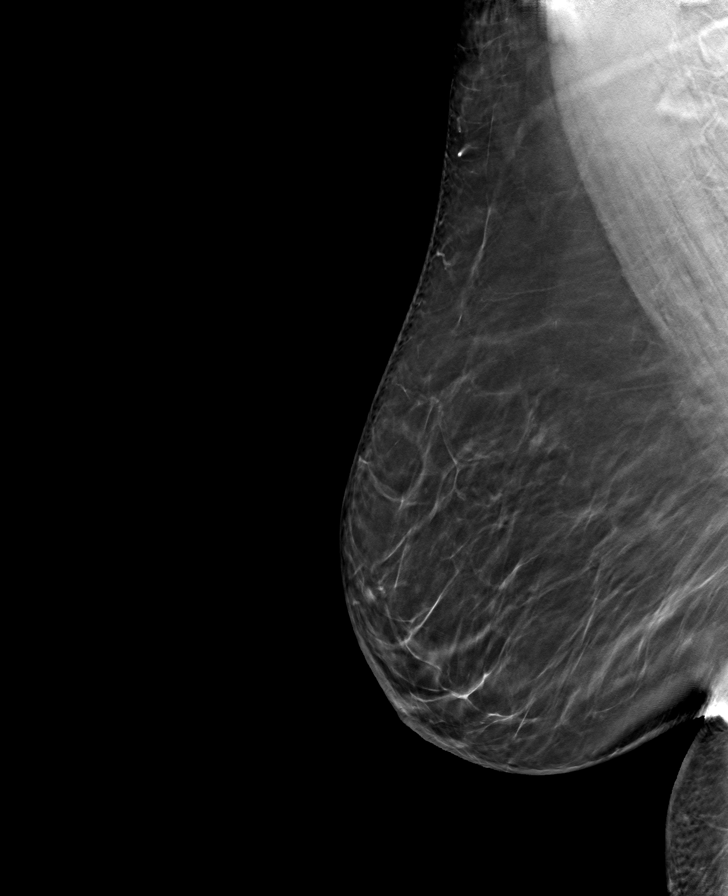

[L CC tomo · tomo slice 37/72.0]
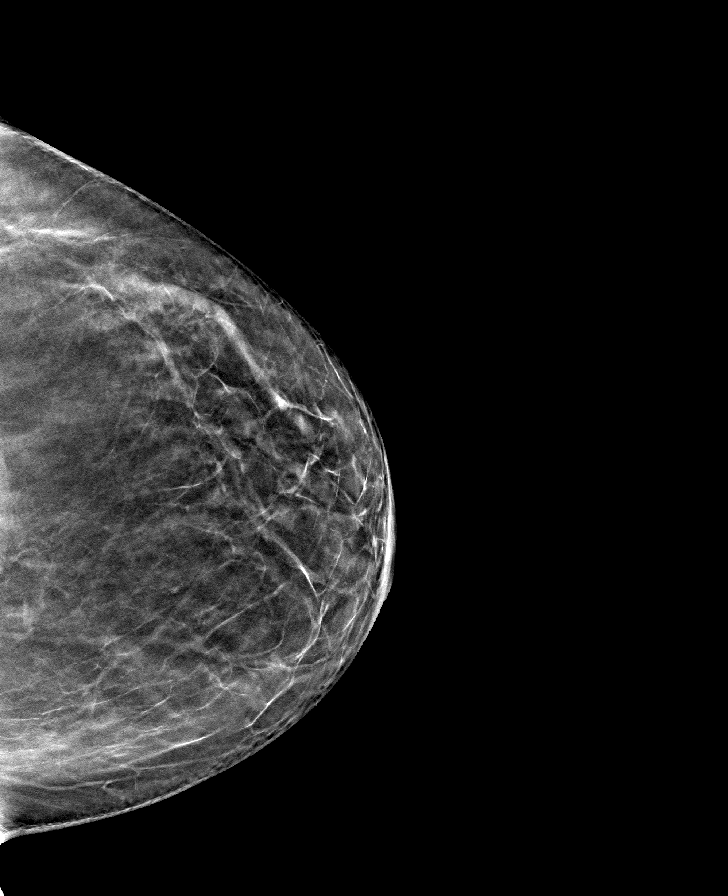

[R CC tomo · tomo slice 34/67.0]
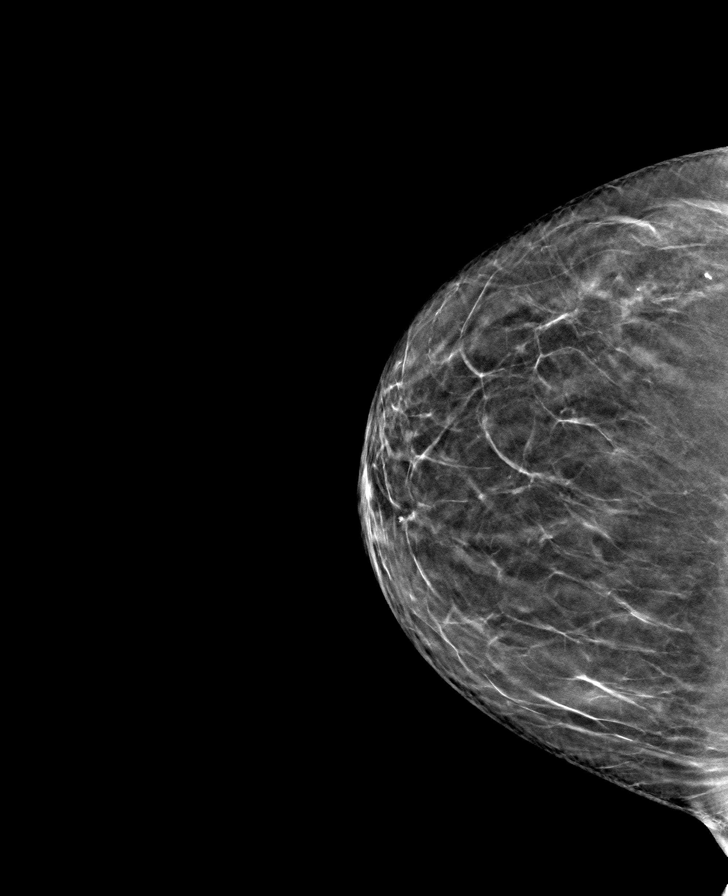

[8 of 24 positions shown; findings below may reference images not displayed]

ACR Breast Density Category b: There are scattered areas of
fibroglandular density.
FINDINGS: There are no findings suspicious for malignancy. The images were
evaluated with computer-aided detection.
IMPRESSION: No mammographic evidence of malignancy. A result letter of this
screening mammogram will be mailed directly to the patient.

RECOMMENDATION:
Screening mammogram in one year. (Code:WJ-I-BG6)

BI-RADS CATEGORY  1: Negative.

## 2022-04-28 ENCOUNTER — Ambulatory Visit
Admission: RE | Admit: 2022-04-28 | Discharge: 2022-04-28 | Disposition: A | Discharge status: Home / Self Care | Payer: Medicare Other | Source: Ambulatory Visit | Attending: Nurse Practitioner | Admitting: Nurse Practitioner

## 2022-04-28 DIAGNOSIS — M85852 Other specified disorders of bone density and structure, left thigh: Secondary | ICD-10-CM | POA: Diagnosis not present

## 2022-04-28 DIAGNOSIS — M8580 Other specified disorders of bone density and structure, unspecified site: Secondary | ICD-10-CM

## 2022-04-28 DIAGNOSIS — Z780 Asymptomatic menopausal state: Secondary | ICD-10-CM | POA: Diagnosis not present

## 2022-05-16 DIAGNOSIS — Z23 Encounter for immunization: Secondary | ICD-10-CM | POA: Diagnosis not present

## 2022-05-16 DIAGNOSIS — H109 Unspecified conjunctivitis: Secondary | ICD-10-CM | POA: Diagnosis not present

## 2022-06-24 DIAGNOSIS — N3000 Acute cystitis without hematuria: Secondary | ICD-10-CM | POA: Diagnosis not present

## 2022-06-30 DIAGNOSIS — R1030 Lower abdominal pain, unspecified: Secondary | ICD-10-CM | POA: Diagnosis not present

## 2022-09-16 DIAGNOSIS — H524 Presbyopia: Secondary | ICD-10-CM | POA: Diagnosis not present

## 2022-09-16 DIAGNOSIS — H2513 Age-related nuclear cataract, bilateral: Secondary | ICD-10-CM | POA: Diagnosis not present

## 2022-09-16 DIAGNOSIS — H5203 Hypermetropia, bilateral: Secondary | ICD-10-CM | POA: Diagnosis not present

## 2022-09-19 ENCOUNTER — Ambulatory Visit (HOSPITAL_COMMUNITY): Payer: Medicare Other | Attending: Cardiovascular Disease

## 2022-09-19 DIAGNOSIS — I350 Nonrheumatic aortic (valve) stenosis: Secondary | ICD-10-CM | POA: Diagnosis not present

## 2022-09-19 LAB — ECHOCARDIOGRAM COMPLETE
AR max vel: 1.27 cm2
AV Area VTI: 1.22 cm2
AV Area mean vel: 1.19 cm2
AV Mean grad: 12.8 mmHg
AV Peak grad: 21.7 mmHg
Ao pk vel: 2.33 m/s
Area-P 1/2: 3.99 cm2
P 1/2 time: 647 msec
S' Lateral: 2.4 cm

## 2022-09-23 ENCOUNTER — Telehealth: Payer: Self-pay | Admitting: Cardiovascular Disease

## 2022-09-23 NOTE — Telephone Encounter (Signed)
Patient is returning call in regards to results. Transferred to Lattie Haw, Therapist, sports.

## 2022-09-23 NOTE — Telephone Encounter (Signed)
Patient made aware of results and verbalized understanding.  

## 2022-10-31 DIAGNOSIS — L0390 Cellulitis, unspecified: Secondary | ICD-10-CM | POA: Diagnosis not present

## 2022-11-09 ENCOUNTER — Other Ambulatory Visit: Payer: Self-pay | Admitting: Family Medicine

## 2022-11-09 DIAGNOSIS — Z0000 Encounter for general adult medical examination without abnormal findings: Secondary | ICD-10-CM

## 2022-11-14 DIAGNOSIS — E559 Vitamin D deficiency, unspecified: Secondary | ICD-10-CM | POA: Diagnosis not present

## 2022-11-14 DIAGNOSIS — Z0000 Encounter for general adult medical examination without abnormal findings: Secondary | ICD-10-CM | POA: Diagnosis not present

## 2022-11-14 DIAGNOSIS — I77810 Thoracic aortic ectasia: Secondary | ICD-10-CM | POA: Diagnosis not present

## 2022-11-14 DIAGNOSIS — I10 Essential (primary) hypertension: Secondary | ICD-10-CM | POA: Diagnosis not present

## 2022-11-14 DIAGNOSIS — I700 Atherosclerosis of aorta: Secondary | ICD-10-CM | POA: Diagnosis not present

## 2022-11-14 DIAGNOSIS — E7800 Pure hypercholesterolemia, unspecified: Secondary | ICD-10-CM | POA: Diagnosis not present

## 2022-11-15 ENCOUNTER — Encounter (HOSPITAL_BASED_OUTPATIENT_CLINIC_OR_DEPARTMENT_OTHER): Payer: Self-pay | Admitting: Family Medicine

## 2022-11-22 ENCOUNTER — Other Ambulatory Visit (HOSPITAL_COMMUNITY): Payer: Self-pay | Admitting: Family Medicine

## 2022-11-22 DIAGNOSIS — I700 Atherosclerosis of aorta: Secondary | ICD-10-CM

## 2022-12-09 ENCOUNTER — Encounter (HOSPITAL_COMMUNITY): Payer: Self-pay

## 2022-12-09 ENCOUNTER — Ambulatory Visit (HOSPITAL_COMMUNITY)
Admission: RE | Admit: 2022-12-09 | Discharge: 2022-12-09 | Disposition: A | Discharge status: Home / Self Care | Payer: Medicare Other | Source: Ambulatory Visit | Attending: Family Medicine | Admitting: Family Medicine

## 2022-12-09 DIAGNOSIS — I700 Atherosclerosis of aorta: Secondary | ICD-10-CM

## 2022-12-09 NOTE — Progress Notes (Signed)
ABI study completed.   Please see CV Procedures for preliminary results.  Rayyan Burley, RVT  3:14 PM 12/09/22

## 2022-12-10 LAB — VAS US ABI WITH/WO TBI
Left ABI: 1.1
Right ABI: 1.1

## 2022-12-28 ENCOUNTER — Encounter: Payer: Self-pay | Admitting: Physician Assistant

## 2022-12-30 ENCOUNTER — Ambulatory Visit
Admission: RE | Admit: 2022-12-30 | Discharge: 2022-12-30 | Disposition: A | Discharge status: Home / Self Care | Payer: Medicare Other | Source: Ambulatory Visit | Attending: Family Medicine | Admitting: Family Medicine

## 2022-12-30 DIAGNOSIS — Z0000 Encounter for general adult medical examination without abnormal findings: Secondary | ICD-10-CM

## 2022-12-30 DIAGNOSIS — Z1231 Encounter for screening mammogram for malignant neoplasm of breast: Secondary | ICD-10-CM | POA: Diagnosis not present

## 2023-02-04 IMAGING — CR DG CHEST 2V
2 series · 2 of 2 positions shown · non-contrast
Comparison: Chest two views 01/25/2017

CLINICAL DATA: Chest discomfort.

EXAM:
CHEST - 2 VIEW

[w chest pa]
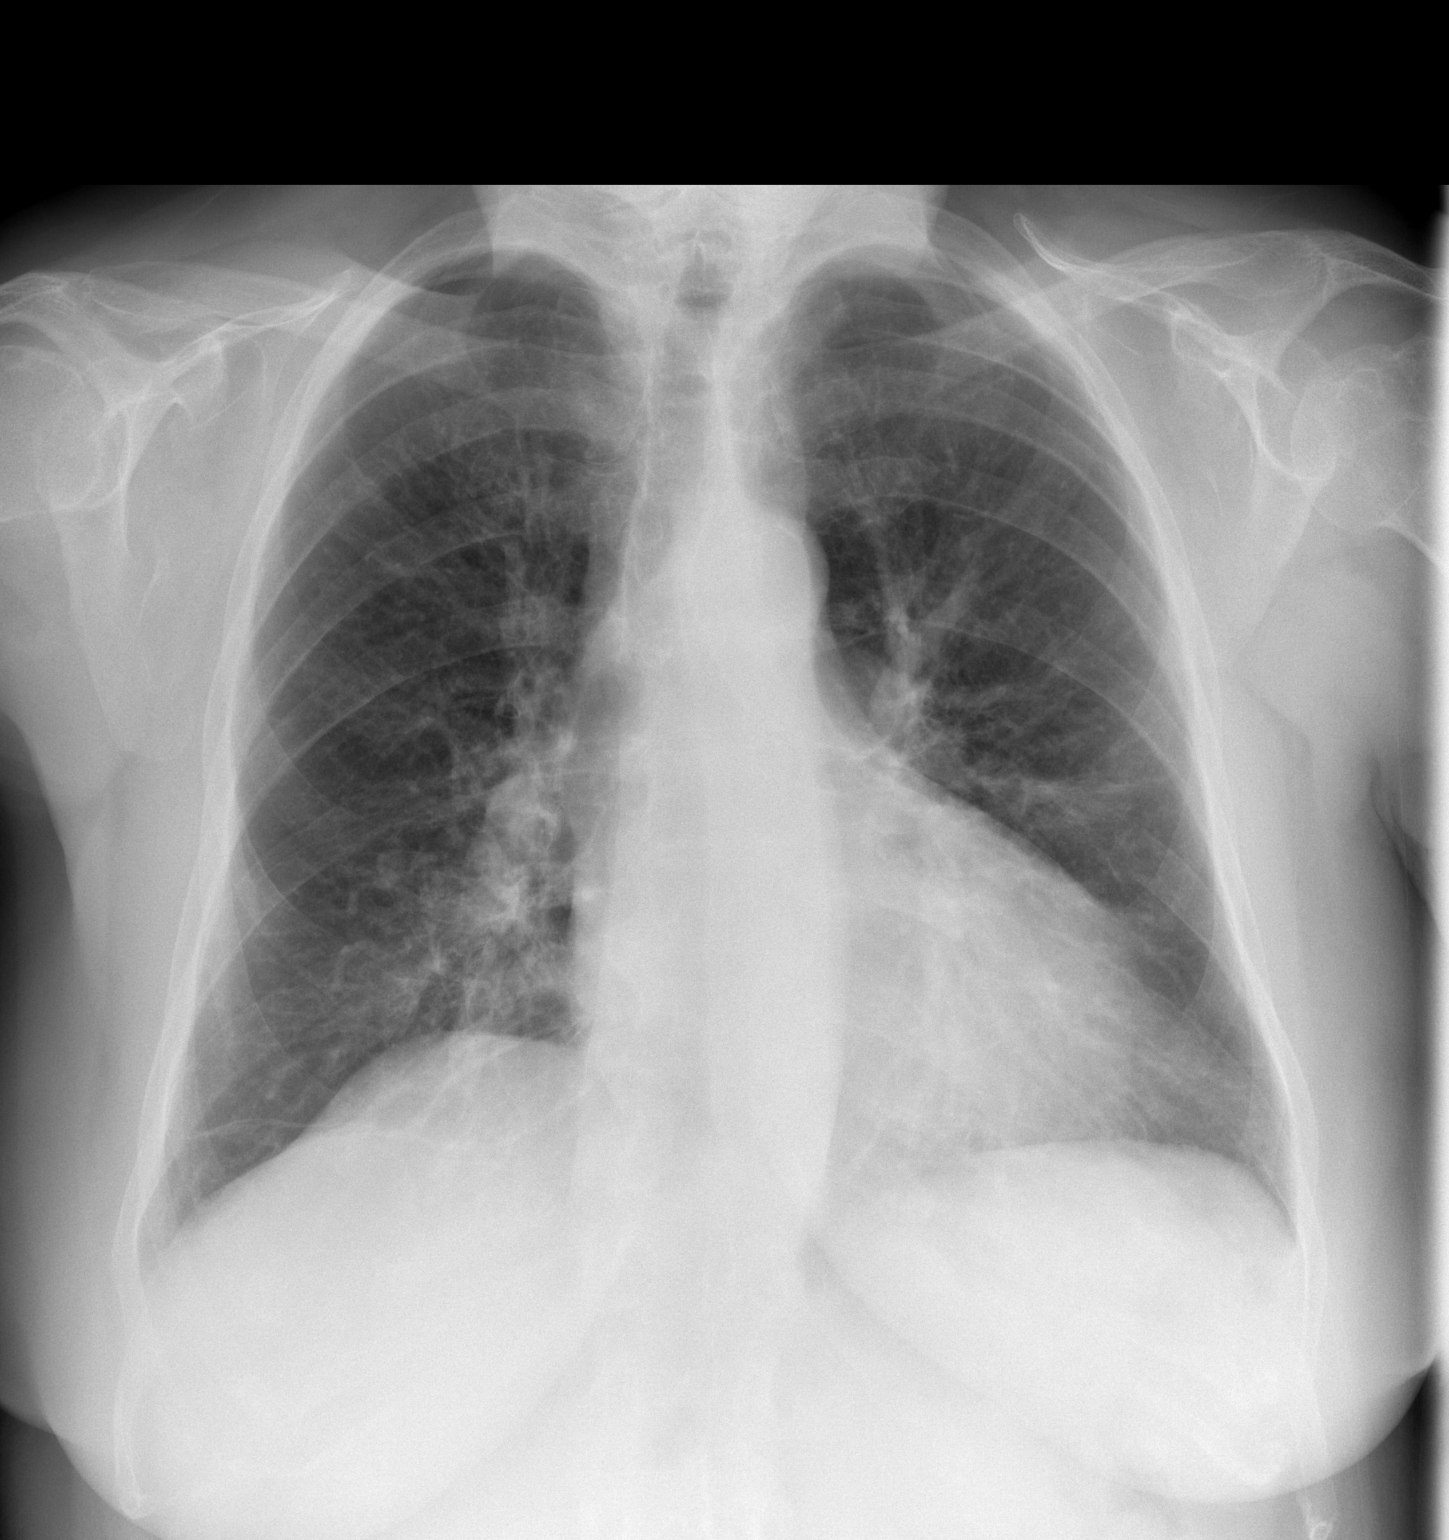

[w chest lat]
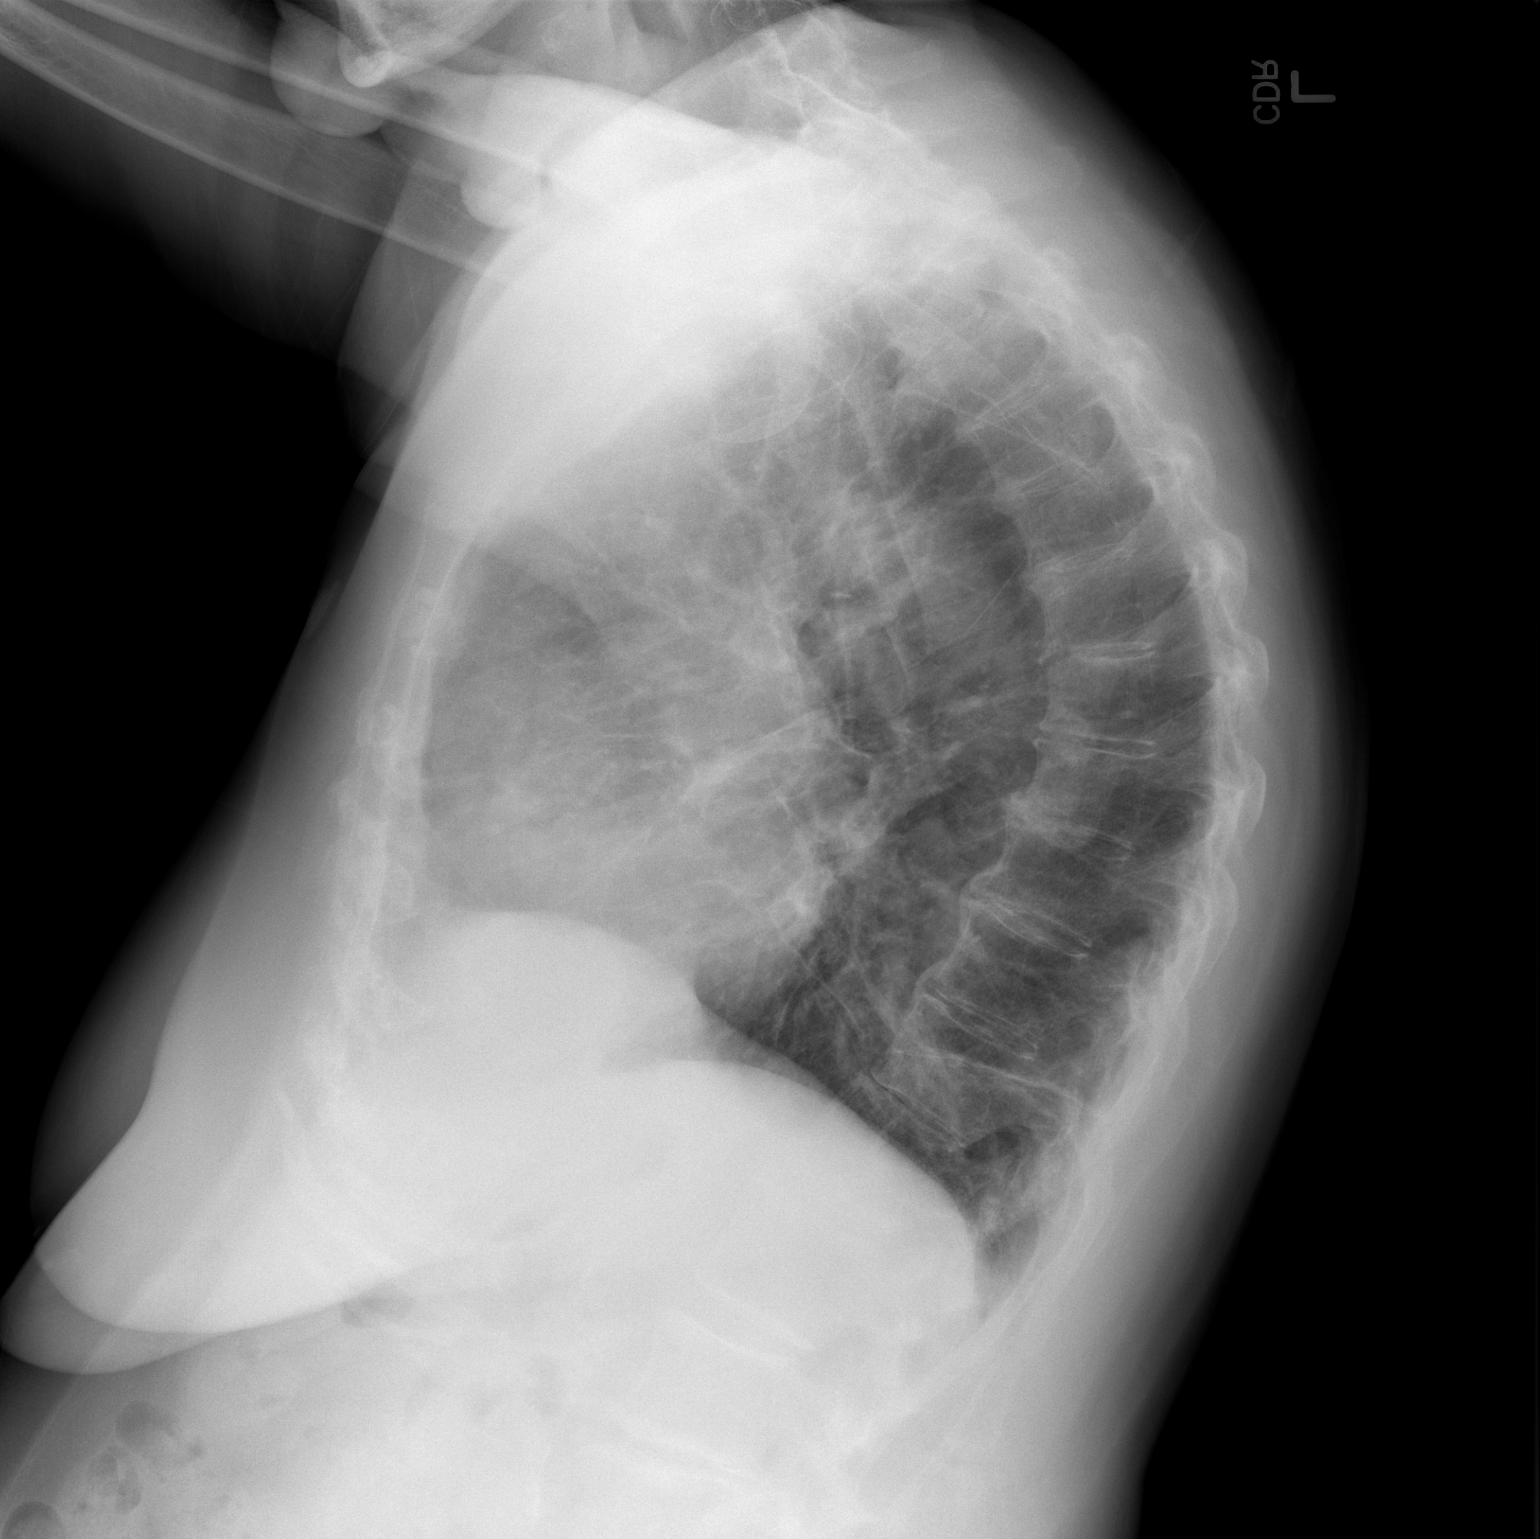

[2 of 2 positions shown; findings below may reference images not displayed]

FINDINGS: Cardiac silhouette and mediastinal contours are within normal
limits. Mild bilateral interstitial thickening and left mid lung and
bibasilar horizontal linear scarring appear unchanged from prior. No
new focal airspace opacity to indicate pneumonia. No pulmonary
edema, pleural effusion, or pneumothorax. Moderate multilevel
degenerative disc changes of the thoracic spine, mildly worsened
from prior.
IMPRESSION: No active cardiopulmonary disease. Mild bilateral chronic
interstitial thickening and left mid and bilateral lower lung
scarring.

## 2023-03-13 ENCOUNTER — Encounter: Payer: Self-pay | Admitting: Physician Assistant

## 2023-03-13 ENCOUNTER — Ambulatory Visit: Payer: Medicare Other | Admitting: Physician Assistant

## 2023-03-13 VITALS — BP 124/72 | HR 66 | Ht 63.0 in | Wt 170.0 lb

## 2023-03-13 DIAGNOSIS — R140 Abdominal distension (gaseous): Secondary | ICD-10-CM

## 2023-03-13 DIAGNOSIS — Z1211 Encounter for screening for malignant neoplasm of colon: Secondary | ICD-10-CM

## 2023-03-13 NOTE — Progress Notes (Signed)
Agree with assessment and plan 

## 2023-03-13 NOTE — Progress Notes (Signed)
Chief Complaint: Discuss colonoscopy  HPI:    [name] [name] is an 82 year old female, known to Dr. Marina Goodell, with a past medical history as listed below including echo 09/19/2022 with EF 60-65% with grade 1 diastolic dysfunction, mild mitral regurgitation and mild aortic valve stenosis with mean gradient 12.8 mmHg, GERD, who was referred to me by Joycelyn Rua, MD for discussion of a colonoscopy.    04/10/2007 colonoscopy with diverticulosis and otherwise normal.    08/21/2012 colonoscopy with Dr. Marina Goodell for heme positive stool with moderate diverticulosis in the sigmoid colon otherwise normal.  Repeat recommended in 10 years.    12/10/2021 CBC with platelets minimally decreased at 144 and otherwise normal.  BMP with a potassium of 3.4 and otherwise normal.    01/05/2022 BMP with a creatinine of 1.01 and otherwise normal.    Today, the patient presents to clinic and tells me that she just wants to discuss another colonoscopy.  Apparently she was told it had been a long time since she had her last one.  She is having no real issues.  Occasionally has some pellet-like stools and will use MiraLAX which relieves the symptoms.  Along with that occasional bloating but no weight loss, blood in her stools or real change in bowel habits or abdominal pain.    Denies fever or chills.  Past Medical History:  Diagnosis Date   Arthritis    Chronic knee pain    Diverticulosis 2008   GERD (gastroesophageal reflux disease)    History of stress test 10/2010   which she execised a 7 met workload achieving a peak heart rate of 141, representing 93% of predicted maximum. Scintigraphic images revealed ormal perfusion.   Hx of echocardiogram 10/2010   which revealed normal systolic as well as diastolic function. she did have mild mitral annular calcification with mild MR and TR. She had mild aortic sclerosis without stenosis.   Hyperlipidemia    Hypertension    Kidney stones    Kidney stones    Obesity    Vitamin D  deficiency     Past Surgical History:  Procedure Laterality Date   CHONDROPLASTY  08/19/2016   Procedure: CHONDROPLASTY patellar femoral joint, medial femoral condyle;  Surgeon: Jodi Geralds, MD;  Location: [facility];  Service: Orthopedics;;   KIDNEY STONE SURGERY     KNEE ARTHROSCOPY WITH EXCISION PLICA Left 08/19/2016   Procedure: KNEE ARTHROSCOPY WITH EXCISION PLICA;  Surgeon: Jodi Geralds, MD;  Location: [facility];  Service: Orthopedics;  Laterality: Left;   KNEE ARTHROSCOPY WITH MEDIAL MENISECTOMY Left 08/19/2016   Procedure: KNEE ARTHROSCOPY WITH MEDIAL MENISECTOMY;  Surgeon: Jodi Geralds, MD;  Location: [facility];  Service: Orthopedics;  Laterality: Left;   Orthroscopic Rt Knee      Current Outpatient Medications  Medication Sig Dispense Refill   aspirin EC 81 MG tablet Take 81 mg by mouth daily.     Cholecalciferol (VITAMIN D3) 2000 UNITS capsule Take 2,000 Units by mouth daily.     hydrochlorothiazide (HYDRODIURIL) 25 MG tablet Take 12.5 mg by mouth daily.      metoprolol tartrate (LOPRESSOR) 25 MG tablet TAKE 1 TABLET (25 MG TOTAL) BY MOUTH 2 (TWO) TIMES DAILY. KEEP OV. 180 tablet 1   pantoprazole (PROTONIX) 40 MG tablet TAKE 1 TABLET BY MOUTH EVERY DAY 90 tablet 3   simvastatin (ZOCOR) 20 MG tablet Take 20 mg by mouth daily.     No current facility-administered medications for this visit.  Allergies as of 03/13/2023 - Review Complete 01/05/2022  Allergen Reaction Noted   Crestor [rosuvastatin calcium] Other (See Comments) 03/15/2019    Family History  Problem Relation Age of Onset   Heart disease Mother    Dementia Mother    Lung cancer Father    Stroke Maternal Grandmother    Heart disease Maternal Grandmother    Colon cancer Neg Hx    Esophageal cancer Neg Hx    Rectal cancer Neg Hx    Stomach cancer Neg Hx     Social History   Socioeconomic History   Marital status: Widowed    Spouse name: Not on file   Number of  children: 2   Years of education: Not on file   Highest education level: Not on file  Occupational History   Occupation: Retired  Tobacco Use   Smoking status: Never   Smokeless tobacco: Never  Vaping Use   Vaping status: Never Used  Substance and Sexual Activity   Alcohol use: Yes    Comment: wine occ   Drug use: No   Sexual activity: Not on file  Other Topics Concern   Not on file  Social History Narrative   Not on file   Social Determinants of Health   Financial Resource Strain: Not on file  Food Insecurity: Not on file  Transportation Needs: Not on file  Physical Activity: Not on file  Stress: Not on file  Social Connections: Not on file  Intimate Partner Violence: Not on file    Review of Systems:    Constitutional: No weight loss, fever or chills Skin: No rash Cardiovascular: No chest pain  Respiratory: No SOB Gastrointestinal: See HPI and otherwise negative Genitourinary: No dysuria  Neurological: No headache, dizziness or syncope Musculoskeletal: No new muscle or joint pain Hematologic: No bleeding  Psychiatric: No history of depression or anxiety   Physical Exam:  Vital signs: BP 124/72   Pulse 66   Ht 5\' 3"  (1.6 m)   Wt 170 lb (77.1 kg)   BMI 30.11 kg/m    Constitutional:   Pleasant elderly Caucasian female appears to be in NAD, Well developed, Well nourished, alert and cooperative Head:  Normocephalic and atraumatic. Eyes:   PEERL, EOMI. No icterus. Conjunctiva pink. Ears:  Normal auditory acuity. Neck:  Supple Throat: Oral cavity and pharynx without inflammation, swelling or lesion.  Respiratory: Respirations even and unlabored. Lungs clear to auscultation bilaterally.   No wheezes, crackles, or rhonchi.  Cardiovascular: Normal S1, S2. No MRG. Regular rate and rhythm. No peripheral edema, cyanosis or pallor.  Gastrointestinal:  Soft, nondistended, nontender. No rebound or guarding. Normal bowel sounds. No appreciable masses or  hepatomegaly. Rectal:  Not performed.  Msk:  Symmetrical without gross deformities. Without edema, no deformity or joint abnormality.  Neurologic:  Alert and  oriented x4;  grossly normal neurologically.  Skin:   Dry and intact without significant lesions or rashes. Psychiatric: Demonstrates good judgement and reason without abnormal affect or behaviors.  No recent labs or imaging.  Assessment: 1.  Screening for colorectal cancer: 2 prior colonoscopies 1 in 2008, then 2014 both with no polyps, just diverticulosis  Plan: 1.  Discussed with patient that there is no need to repeat a screening colonoscopy especially in her case with no history of polyps at all.  She is having no red flag symptoms.  We discussed what these would be including weight loss, change in bowel habits, abdominal pain or rectal bleeding.  At that  point she would need to call us back, but for now no further screening colonoscopies.  Risk outweighs benefit given age. 2.  Patient will call back if she has any further questions or concerns.  Hyacinth Meeker, PA-C [facility] 03/13/2023, 9:17 AM  Cc: Joycelyn Rua, MD

## 2023-03-24 ENCOUNTER — Emergency Department (HOSPITAL_BASED_OUTPATIENT_CLINIC_OR_DEPARTMENT_OTHER): Payer: Medicare Other

## 2023-03-24 ENCOUNTER — Encounter (HOSPITAL_BASED_OUTPATIENT_CLINIC_OR_DEPARTMENT_OTHER): Payer: Self-pay | Admitting: Urology

## 2023-03-24 ENCOUNTER — Emergency Department (HOSPITAL_BASED_OUTPATIENT_CLINIC_OR_DEPARTMENT_OTHER)
Admission: EM | Admit: 2023-03-24 | Discharge: 2023-03-24 | Disposition: A | Discharge status: Home / Self Care | Payer: Medicare Other | Attending: Emergency Medicine | Admitting: Emergency Medicine

## 2023-03-24 DIAGNOSIS — R600 Localized edema: Secondary | ICD-10-CM | POA: Diagnosis not present

## 2023-03-24 DIAGNOSIS — I10 Essential (primary) hypertension: Secondary | ICD-10-CM | POA: Insufficient documentation

## 2023-03-24 DIAGNOSIS — M79605 Pain in left leg: Secondary | ICD-10-CM | POA: Diagnosis not present

## 2023-03-24 DIAGNOSIS — M7989 Other specified soft tissue disorders: Secondary | ICD-10-CM | POA: Diagnosis not present

## 2023-03-24 NOTE — ED Notes (Signed)
US at bedside for DVT study

## 2023-03-24 NOTE — ED Triage Notes (Signed)
Pt sent from pcp for R/o DVT of left leg after left leg swelling x 1 week, denies any pain

## 2023-03-24 NOTE — Discharge Instructions (Addendum)
Your ultrasound did not show evidence of blood clot in your leg.  You may use compression stockings and keep this elevated to reduce swelling.  Please follow-up with your primary care doctor.  If you develop any sudden onset chest pain or shortness of breath please return to the emergency department.

## 2023-03-24 NOTE — ED Provider Notes (Signed)
Emergency Department Provider Note   I have reviewed the triage vital signs and the nursing notes.   HISTORY  Chief Complaint Leg Swelling   HPI [name] [name] is a 82 y.o. female with past history of GERD, hyperlipidemia, hypertension presents to the emergency department with left leg swelling that been worsening over the past week.  She did have a [place] car ride to the beach recently.  No prior history of DVT chest pain, palpitations, shortness of breath.  She is not currently anticoagulated recent surgeries.  Denies pain or color change to the leg.  No injury.   Past Medical History:  Diagnosis Date   Arthritis    Chronic knee pain    Diverticulosis 2008   GERD (gastroesophageal reflux disease)    History of stress test 10/2010   which she execised a 7 met workload achieving a peak heart rate of 141, representing 93% of predicted maximum. Scintigraphic images revealed ormal perfusion.   Hx of echocardiogram 10/2010   which revealed normal systolic as well as diastolic function. she did have mild mitral annular calcification with mild MR and TR. She had mild aortic sclerosis without stenosis.   Hyperlipidemia    Hypertension    Kidney stones    Kidney stones    Obesity    Vitamin D deficiency     Review of Systems  Constitutional: No fever/chills Cardiovascular: Denies chest pain. Respiratory: Denies shortness of breath. Gastrointestinal: No abdominal pain. Musculoskeletal: Positive left leg pain.  Skin: Negative for rash. Neurological: Negative for numbness.   ____________________________________________   PHYSICAL EXAM:  VITAL SIGNS: ED Triage Vitals  Encounter Vitals Group     BP 03/24/23 1508 (!) 141/83     Pulse Rate 03/24/23 1508 86     Resp 03/24/23 1508 18     Temp 03/24/23 1508 99.2 F (37.3 C)     Temp Source 03/24/23 1508 Oral     SpO2 03/24/23 1508 95 %     Weight 03/24/23 1506 169 lb 15.6 oz (77.1 kg)     Height 03/24/23 1506 5\' 3"  (1.6  m)   Constitutional: Alert and oriented. Well appearing and in no acute distress. Eyes: Conjunctivae are normal.  Head: Atraumatic. Nose: No congestion/rhinnorhea. Mouth/Throat: Mucous membranes are moist.   Neck: No stridor.   Cardiovascular: Normal rate, regular rhythm. Good peripheral circulation. 2+ DP and PT pulses bilaterally. Grossly normal heart sounds.   Respiratory: Normal respiratory effort.   Gastrointestinal: No distention.  Musculoskeletal:  No gross deformities of extremities. Mild edema to the left calf. Superficial are slightly engorged compared to the right.  Neurologic:  Normal speech and language.  Skin:  Skin is warm, dry and intact. No rash noted.  ____________________________________________  RADIOLOGY  No results found.  ____________________________________________   PROCEDURES  Procedure(s) performed:   Procedures   ____________________________________________   INITIAL IMPRESSION / ASSESSMENT AND PLAN / ED COURSE  Pertinent labs & imaging results that were available during my care of the patient were reviewed by me and considered in my medical decision making (see chart for details).   This patient is Presenting for Evaluation of leg pain, which does require a range of treatment options, and is a complaint that involves a high risk of morbidity and mortality.  The Differential Diagnoses includes DVT, peripheral clot, baker's cyst, cellulitis, compartment syndrome, etc.  Radiologic Tests Ordered, included DVT US. I independently interpreted the images and agree with radiology interpretation.   Medical Decision Making:  Summary:  Patient presents emergency department with left leg swelling.  No pain.  Pulses and sensation are intact.  Compartments are soft.  Plan for DVT ultrasound and reassess.  Reevaluation with update and discussion with   ***Considered admission***  Patient's presentation is most consistent with acute presentation with  potential threat to life or bodily function.   Disposition:   ____________________________________________  FINAL CLINICAL IMPRESSION(S) / ED DIAGNOSES  Final diagnoses:  None     NEW OUTPATIENT MEDICATIONS STARTED DURING THIS VISIT:  New Prescriptions   No medications on file    Note:  This document was prepared using Dragon voice recognition software and may include unintentional dictation errors.  Alona Bene, MD, [facility]

## 2023-04-07 DIAGNOSIS — R109 Unspecified abdominal pain: Secondary | ICD-10-CM | POA: Diagnosis not present

## 2023-04-07 DIAGNOSIS — Z23 Encounter for immunization: Secondary | ICD-10-CM | POA: Diagnosis not present

## 2023-05-16 ENCOUNTER — Ambulatory Visit: Payer: Medicare Other | Attending: Cardiovascular Disease | Admitting: Cardiovascular Disease

## 2023-05-16 ENCOUNTER — Encounter: Payer: Self-pay | Admitting: Cardiovascular Disease

## 2023-05-16 VITALS — BP 108/74 | HR 73 | Ht 64.0 in | Wt 167.2 lb

## 2023-05-16 DIAGNOSIS — I340 Nonrheumatic mitral (valve) insufficiency: Secondary | ICD-10-CM

## 2023-05-16 DIAGNOSIS — I77810 Thoracic aortic ectasia: Secondary | ICD-10-CM

## 2023-05-16 DIAGNOSIS — I10 Essential (primary) hypertension: Secondary | ICD-10-CM

## 2023-05-16 DIAGNOSIS — K219 Gastro-esophageal reflux disease without esophagitis: Secondary | ICD-10-CM

## 2023-05-16 DIAGNOSIS — I350 Nonrheumatic aortic (valve) stenosis: Secondary | ICD-10-CM | POA: Diagnosis not present

## 2023-05-16 NOTE — Progress Notes (Signed)
Patient ID: [name] [name], female   DOB: [birth year], 82 y.o.   MRN: [id number]       HPI: [name] [name] is a 82 y.o. female who presents to the office for an 47 month cardiology evaluation.     [name] [name] has a history of hypertension, hyperlipidemia, mild obesity and GERD. An echo Doppler study in 2012 performend after she experienced exertional shortness of while visiting her daughter in [place] revealed normal systolic function with mild mitral annular calcification mild MR, mild TR, and mild aortic valve sclerosis. She has a history of hyperkalemia in the past.   In March 2015 a three-year follow-up echo Doppler study confirmed normal systolic function with an ejection fraction of 60-65%.  She had grade 1 diastolic dysfunction.  There was mild aortic and mitral insufficiency as well as tricuspid insufficiency.  There was mild dilatation of her left atrium.  Laboratory done at [facility] on 07/04/2014 : Glucose was normal at 91.  She normal renal function with a BUN of 12, guarding 0.82.  LFTs were normal.  She is on vitamin D 2000 mg daily and her vitamin D level was normal at 38.5.  Lipid studies were good with a total cholesterol 153, triglycerides 83, HDL 54, LDL 82.  She has had left knee problems.  She has undergone an MRI and has seen Dr. Luiz Blare.  In May 2017 she developed left lower extremity pain radiating to her calf.  She underwent lower extremity venous duplex imaging which was negative for DVT.  There was no reflux.  She denies any chest pain or shortness of breath.  She denies PND or orthopnea.  She denies palpitations.  I last saw her in December 2017 at which time she was on amlodipine 5 mg and hydrochlorothiazide 12.5 mg for hypertension.  She is on simvastatin 20 mg and fish oil 2 g daily for hyperlipidemia.  She takes omeprazole 20 mg for GERD.  She takes 81 mg aspirin.  She has a remote history of kidney stones and has been without recurrence.  She has been seen by [name] Course, PA-C on several occasions.  She had undergone an echo Doppler study June 2018 which revealed normal systolic and diastolic function.  EF was 60 to 65%.  There was mild aortic sclerosis with mild AR and mitral annular calcification.  In August 2019 she had complaints of occasional chest fluttering and her ECG showed frequent PACs.  At that time her amlodipine was discontinued and she was started on metoprolol tartrate.  She was last seen by [name] [name], PAC 2020.  Due to development of intermittent left-sided chest pain, she was referred for a Lexiscan Myoview study.  This was done on Jan 11, 2019 and revealed normal myocardial perfusion.  LV function was hyperdynamic.  I saw her in August 2020 at which time she remained stable without chest pain or palpitations.  She remained active.  She will be having her fifth great grandchild.  She was walking at least 3 days/week and denied any exertional symptomatology.   She was evaluated by [name] [name] in August 2021 and remained stable.  I saw her on October 16, 2020.  At that time she remained stable and was turning 80 at the end of the month.  Her blood pressure was stable and she was not having any palpitations.  I recommended she undergo a follow-up echo Doppler study for reassessment of her aortic stenosis and this was done on November 11, 2020.  LV function remains normal at 60 to 65% EF and she had grade 2 diastolic dysfunction.  She continued to have mild aortic valve stenosis with a mean gradient of 13 mm, and peak gradient of 22.7 mm with an estimated aortic valve area of 1.99 cm.  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    Over the past year she has remained stable.  She apparently had developed some musculoskeletal neck shoulder discomfort which led to an ER evaluation at [facility] on December 10, 2021.  She was felt to have musculoskeletal etiology to her discomfort.  Troponins were negative.  Laboratory was normal with the  exception of low potassium at 3.4.  I last saw her on Jan 05, 2022. Presently, she feels well.  Her neck shoulder discomfort has resolved.  She is unaware of palpitations.  She continues to be on HCTZ 12.5 mg and metoprolol tartrate 25 mg twice a day for blood pressure.  She has not had recent palpitations.  She is on pantoprazole for GERD.  She takes simvastatin 20 mg for hyperlipidemia.  Lipid studies done by her primary physician on November 04 2021 showed total cholesterol 141, HDL 56, LDL 71 and triglycerides 70.    Since I last saw her, she has been without chest pain shortness of breath palpitations presyncope or syncope.  She has been followed by Dr. Silvestre Moment for primary care.  Laboratory on November 14, 2022 showed total cholesterol 130, HDL 50, LDL 63 and triglycerides 85.  Creatinine was 0.88.  She has been on hydrochlorothiazide 12.5 mg daily, and metoprolol to tartrate 25 mg twice a day.  She also has been on simvastatin 20 mg daily for hyperlipidemia.  She has taken a baby aspirin.  She underwent lower extremity arterial Doppler studies on December 09, 2022 which showed normal ankle brachial index bilaterally.  Apparently she had swelling of her left leg in August 2024 presented to the ER.  Lower extremity venous Doppler study was negative for DVT.  Presently, she feels well.  She presents for follow-up evaluation.  Thanks   Past Medical History:  Diagnosis Date   Arthritis    Chronic knee pain    Diverticulosis 2008   GERD (gastroesophageal reflux disease)    History of stress test 10/2010   which she execised a 7 met workload achieving a peak heart rate of 141, representing 93% of predicted maximum. Scintigraphic images revealed ormal perfusion.   Hx of echocardiogram 10/2010   which revealed normal systolic as well as diastolic function. she did have mild mitral annular calcification with mild MR and TR. She had mild aortic sclerosis without stenosis.   Hyperlipidemia    Hypertension     Kidney stones    Kidney stones    Obesity    Vitamin D deficiency     Past Surgical History:  Procedure Laterality Date   CHONDROPLASTY  08/19/2016   Procedure: CHONDROPLASTY patellar femoral joint, medial femoral condyle;  Surgeon: Jodi Geralds, MD;  Location: [facility];  Service: Orthopedics;;   KIDNEY STONE SURGERY     KNEE ARTHROSCOPY WITH EXCISION PLICA Left 08/19/2016   Procedure: KNEE ARTHROSCOPY WITH EXCISION PLICA;  Surgeon: Jodi Geralds, MD;  Location: [facility];  Service: Orthopedics;  Laterality: Left;   KNEE ARTHROSCOPY WITH MEDIAL MENISECTOMY Left 08/19/2016   Procedure: KNEE ARTHROSCOPY WITH MEDIAL MENISECTOMY;  Surgeon: Jodi Geralds, MD;  Location: [facility];  Service:  Orthopedics;  Laterality: Left;   Orthroscopic Rt Knee      Allergies  Allergen Reactions   Crestor [Rosuvastatin Calcium] Other (See Comments)    Leg pain    Current Outpatient Medications  Medication Sig Dispense Refill   acetaminophen (TYLENOL) 325 MG tablet Take 325 mg by mouth every 6 (six) hours as needed for mild pain or moderate pain.     aspirin EC 81 MG tablet Take 81 mg by mouth daily.     Calcium Carb-Cholecalciferol (CALTRATE 600+D3) 600-20 MG-MCG TABS Take 1 tablet by mouth daily.     Calcium Carbonate-Vit D-Min (CALTRATE 600+D PLUS PO) Take 1 capsule by mouth daily.     hydrochlorothiazide (HYDRODIURIL) 25 MG tablet Take 12.5 mg by mouth daily.      metoprolol tartrate (LOPRESSOR) 25 MG tablet TAKE 1 TABLET (25 MG TOTAL) BY MOUTH 2 (TWO) TIMES DAILY. KEEP OV. 180 tablet 1   pantoprazole (PROTONIX) 40 MG tablet TAKE 1 TABLET BY MOUTH EVERY DAY 90 tablet 3   simvastatin (ZOCOR) 20 MG tablet Take 20 mg by mouth daily.     No current facility-administered medications for this visit.    Social History   Socioeconomic History   Marital status: Widowed    Spouse name: Not on file   Number of children: 2   Years of education: Not on file    Highest education level: Not on file  Occupational History   Occupation: Retired  Tobacco Use   Smoking status: Never   Smokeless tobacco: Never  Vaping Use   Vaping status: Never Used  Substance and Sexual Activity   Alcohol use: Yes    Comment: wine occ   Drug use: No   Sexual activity: Not on file  Other Topics Concern   Not on file  Social History Narrative   Not on file   Social Determinants of Health   Financial Resource Strain: Not on file  Food Insecurity: Not on file  Transportation Needs: Not on file  Physical Activity: Not on file  Stress: Not on file  Social Connections: Not on file  Intimate Partner Violence: Not on file   Socially she is widowed and has 2 children 4 grandchildren 2 great-grandchildren. There is no tobacco or alcohol history.  Family History  Problem Relation Age of Onset   Heart disease Mother    Dementia Mother    Lung cancer Father    Stroke Maternal Grandmother    Heart disease Maternal Grandmother    Colon cancer Neg Hx    Esophageal cancer Neg Hx    Rectal cancer Neg Hx    Stomach cancer Neg Hx     ROS General: Negative; No fevers, chills, or night sweats;  HEENT: Negative; No changes in vision or hearing, sinus congestion, difficulty swallowing Pulmonary: Negative; No cough, wheezing, shortness of breath, hemoptysis Cardiovascular: See HPI GI: Positive for GERD, stable; No nausea, vomiting, diarrhea, or abdominal pain GU: Negative; No dysuria, hematuria, or difficulty voiding Musculoskeletal: Positive for left knee discomfort Hematologic/Oncology: Negative; no easy bruising, bleeding Endocrine: Negative; no heat/cold intolerance; no diabetes Neuro: Negative; no changes in balance, headaches Skin: Negative; No rashes or skin lesions Psychiatric: Negative; No behavioral problems, depression Sleep: Negative; No snoring, daytime sleepiness, hypersomnolence, bruxism, restless legs, hypnogognic hallucinations, no cataplexy Other  comprehensive 14 point system review is negative.  PE BP 108/74   Pulse 73   Ht 5\' 4"  (1.626 m)   Wt 167 lb 3.2 oz (75.8 kg)  SpO2 92%   BMI 28.70 kg/m    Repeat blood pressure by me was 110/64  Wt Readings from Last 3 Encounters:  05/16/23 167 lb 3.2 oz (75.8 kg)  03/24/23 169 lb 15.6 oz (77.1 kg)  03/13/23 170 lb (77.1 kg)     Physical Exam BP 108/74   Pulse 73   Ht 5\' 4"  (1.626 m)   Wt 167 lb 3.2 oz (75.8 kg)   SpO2 92%   BMI 28.70 kg/m  General: Alert, oriented, no distress.  Skin: normal turgor, no rashes, warm and dry HEENT: Normocephalic, atraumatic. Pupils equal round and reactive to light; sclera anicteric; extraocular muscles intact; Fundi ** Nose without nasal septal hypertrophy Mouth/Parynx benign; Mallinpatti scale Neck: No JVD, no carotid bruits; normal carotid upstroke Lungs: clear to ausculatation and percussion; no wheezing or rales Chest wall: without tenderness to palpitation Heart: PMI not displaced, RRR, s1 s2 normal, 2/6 mid peaking systolic murmur, no diastolic murmur, no rubs, gallops, thrills, or heaves Abdomen: soft, nontender; no hepatosplenomehaly, BS+; abdominal aorta nontender and not dilated by palpation. Back: no CVA tenderness Pulses 2+ Musculoskeletal: full range of motion, normal strength, no joint deformities Extremities: no clubbing cyanosis or edema, Homan's sign negative  Neurologic: grossly nonfocal; Cranial nerves grossly wnl Psychologic: Normal mood and affect  Hardware EKG Interpretation Date/Time:  Tuesday May 16 2023 16:52:56 EDT Ventricular Rate:  73 PR Interval:  130 QRS Duration:  78 QT Interval:  390 QTC Calculation: 429 R Axis:   79  Text Interpretation: Normal sinus rhythm Normal ECG When compared with ECG of 10-Dec-2021 17:40, No significant change was found Confirmed by Nicki Guadalajara ([id number]) on 05/16/2023 6:00:29 PM    Jan 05, 2022 ECG (independently read by me): Normal sinus rhythm at 70 bpm.  October 16, 2020 ECG (independently read by me): Normal sinus rhythm at 73 with mild sinus arrhythmia.  Nonspecific T changes in V1 and V2, unchanged.  Normal intervals  December 2017 ECG (independently read by me): Normal sinus rhythm at 82 bpm.  No ectopy.  Normal intervals.  November 2016 ECG (independently read by me): Normal sinus rhythm at 77 bpm.  No significant ST changes.  Normal intervals.  November 2015 ECG (independently read by me): Normal sinus rhythm at 78 bpm.  Nonspecific T-wave abnormalities, not significant change.  QTc interval 446 ms.  September 2014 ECG: Sinus rhythm at 72 beats per minute;  nonspecific T changes  LABS:     Latest Ref Rng & Units 01/05/2022    9:27 AM 12/10/2021    6:00 PM 10/22/2020    8:54 AM  BMP  Glucose 70 - 99 mg/dL 213  93  97   BUN 8 - 27 mg/dL 16  17  16    Creatinine 0.57 - 1.00 mg/dL 0.86  5.78  4.69   BUN/Creat Ratio 12 - 28 16   18    Sodium 134 - 144 mmol/L 141  140  144   Potassium 3.5 - 5.2 mmol/L 4.0  3.4  4.4   Chloride 96 - 106 mmol/L 100  104  103   CO2 20 - 29 mmol/L 31  29  26    Calcium 8.7 - 10.3 mg/dL 9.2  9.0  9.7       Latest Ref Rng & Units 10/22/2020    8:54 AM 04/12/2019    8:13 AM 03/15/2019    7:23 PM  Hepatic Function  Total Protein 6.0 - 8.5 g/dL 7.1  6.8  7.1  Albumin 3.7 - 4.7 g/dL 4.2  4.1  3.8   AST 0 - 40 IU/L 16  16  18    ALT 0 - 32 IU/L 10  8  13    Alk Phosphatase 44 - 121 IU/L 83  81  69   Total Bilirubin 0.0 - 1.2 mg/dL 0.5  0.6  1.1       Latest Ref Rng & Units 12/10/2021    6:00 PM 10/22/2020    8:54 AM 04/12/2019    8:13 AM  CBC  WBC 4.0 - 10.5 K/uL 7.1  5.9  5.6   Hemoglobin 12.0 - 15.0 g/dL 16.1  09.6  04.5   Hematocrit 36.0 - 46.0 % 39.5  41.9  41.2   Platelets 150 - 400 K/uL 144  197  178    Lab Results  Component Value Date   MCV 93.6 12/10/2021   MCV 94 10/22/2020   MCV 93 04/12/2019   Lab Results  Component Value Date   TSH 0.979 10/22/2020     Lipid Panel     Component Value  Date/Time   CHOL 192 10/22/2020 0854   CHOL 181 05/20/2013 0909   TRIG 78 10/22/2020 0854   TRIG 86 05/20/2013 0909   HDL 53 10/22/2020 0854   HDL 49 05/20/2013 0909   CHOLHDL 3.6 10/22/2020 0854   CHOLHDL 2.8 11/29/2013 1034   VLDL 15 11/29/2013 1034   LDLCALC 125 (H) 10/22/2020 0854   LDLCALC 115 (H) 05/20/2013 0909    RADIOLOGY: No results found.  IMPRESSION:  1. Primary hypertension   2. Aortic valve sclerosis   3. Mild aortic insufficiency   4. Mild mitral regurgitation   5. Essential hypertension     ASSESSMENT AND PLAN: Ms. Bjorn is a very pleasant 82  year-old female who has a history of hypertension, hyperlipidemia, mild obesity, and GERD.  Remotely, she had experienced palpitations which led to changing her amlodipine to metoprolol with resolution.  She has documented mild aortic stenosis and her most recent echo from November 12, 2021 continued to show mild AS with excellent LV function with EF at 60 to 65%, a mean gradient of 13 and peak gradient of almost 23 mmHg.  When I last saw her in May 2023 she had recently experienced some left shoulder and neck musculoskeletal discomfort which has resolved.  Her blood pressure was stable on her current regimen of low-dose HCTZ 12.5 mg and metoprolol.  Since I last saw her, she has continued to be followed by Dr. Silvestre Moment.  She underwent follow-up laboratory which showed total cholesterol 130 HDL 50 LDL 63 and triglycerides 85 on November 14, 2022.  Potassium was 4.1. When I last saw her, I had suggested a follow-up echo Doppler study this year which she had not done.  Her blood pressure today is stable and on repeat by me was 110/64 on her current regimen of HCTZ 12.5 mg and metoprolol tartrate 25 mg twice a day.  She takes pantoprazole for GERD.  She continues to be asymptomatic with reference to her aortic stenosis.  Presently I am recommending she undergo a follow-up echo Doppler study and I will schedule this to be done in March  2025 or sooner if there is any change in symptomatology.  I will see her in March in follow-up of the echo Doppler study for reassessment and further evaluation.  I reviewed her most recent lower extremity Doppler studies including both arterial and venous evaluations.  She will  be following up with Dr. Izola Price.  Lennette Bihari, MD, [facility]  05/16/2023 6:00 PM

## 2023-05-16 NOTE — Patient Instructions (Signed)
Medication Instructions:  NO MEDICATION CHANGES *If you need a refill on your cardiac medications before your next appointment, please call your pharmacy*   Lab Work: NONE If you have labs (blood work) drawn today and your tests are completely normal, you will receive your results only by: MyChart Message (if you have MyChart) OR A paper copy in the mail If you have any lab test that is abnormal or we need to change your treatment, we will call you to review the results.   Testing/Procedures: Your physician has requested that you have an echocardiogram MARCH 2025 . Echocardiography is a painless test that uses sound waves to create images of your heart. It provides your doctor with information about the size and shape of your heart and how well your heart's chambers and valves are working. This procedure takes approximately one hour. There are no restrictions for this procedure. Please do NOT wear cologne, perfume, aftershave, or lotions (deodorant is allowed). Please arrive 15 minutes prior to your appointment time.    Follow-Up: At [facility], you and your health needs are our priority.  As part of our continuing mission to provide you with exceptional heart care, we have created designated Provider Care Teams.  These Care Teams include your primary Cardiologist (physician) and Advanced Practice Providers (APPs -  Physician Assistants and Nurse Practitioners) who all work together to provide you with the care you need, when you need it.  We recommend signing up for the patient portal called "MyChart".  Sign up information is provided on this After Visit Summary.  MyChart is used to connect with patients for Virtual Visits (Telemedicine).  Patients are able to view lab/test results, encounter notes, upcoming appointments, etc.  Non-urgent messages can be sent to your provider as well.   To learn more about what you can do with MyChart, go to [email or web address].    Your next  appointment:   6 month(s)  Provider:   Nicki Guadalajara, MD

## 2023-09-06 DIAGNOSIS — R309 Painful micturition, unspecified: Secondary | ICD-10-CM | POA: Diagnosis not present

## 2023-09-19 DIAGNOSIS — H2513 Age-related nuclear cataract, bilateral: Secondary | ICD-10-CM | POA: Diagnosis not present

## 2023-10-16 ENCOUNTER — Other Ambulatory Visit (HOSPITAL_COMMUNITY): Payer: Medicare Other

## 2023-10-18 ENCOUNTER — Ambulatory Visit (HOSPITAL_COMMUNITY): Payer: Medicare Other | Attending: Cardiovascular Disease

## 2023-10-18 DIAGNOSIS — I350 Nonrheumatic aortic (valve) stenosis: Secondary | ICD-10-CM

## 2023-10-18 LAB — ECHOCARDIOGRAM COMPLETE
AR max vel: 1.16 cm2
AV Area VTI: 0.99 cm2
AV Area mean vel: 0.98 cm2
AV Mean grad: 21 mmHg
AV Peak grad: 33.4 mmHg
Ao pk vel: 2.89 m/s
Area-P 1/2: 3.81 cm2
P 1/2 time: 421 ms
S' Lateral: 2.7 cm

## 2023-10-20 ENCOUNTER — Other Ambulatory Visit: Payer: Self-pay | Admitting: Family Medicine

## 2023-10-20 DIAGNOSIS — Z1231 Encounter for screening mammogram for malignant neoplasm of breast: Secondary | ICD-10-CM

## 2023-11-01 ENCOUNTER — Telehealth: Payer: Self-pay | Admitting: Cardiovascular Disease

## 2023-11-01 NOTE — Telephone Encounter (Signed)
 Pt requesting cb to discuss echo results. Not fully understanding my chart

## 2023-11-01 NOTE — Telephone Encounter (Signed)
   Patient identification verified by 2 forms. Marilynn Rail, RN    Called and spoke to patient  Relayed Echo results  Patient scheduled for follow up 6/4 with Dr. Tresa Endo  Patient verbalized understanding, no questions at this time

## 2023-11-15 DIAGNOSIS — I10 Essential (primary) hypertension: Secondary | ICD-10-CM | POA: Diagnosis not present

## 2023-11-15 DIAGNOSIS — M8580 Other specified disorders of bone density and structure, unspecified site: Secondary | ICD-10-CM | POA: Diagnosis not present

## 2023-11-15 DIAGNOSIS — Z9181 History of falling: Secondary | ICD-10-CM | POA: Diagnosis not present

## 2023-11-15 DIAGNOSIS — E559 Vitamin D deficiency, unspecified: Secondary | ICD-10-CM | POA: Diagnosis not present

## 2023-11-15 DIAGNOSIS — Z0000 Encounter for general adult medical examination without abnormal findings: Secondary | ICD-10-CM | POA: Diagnosis not present

## 2023-11-15 DIAGNOSIS — E7800 Pure hypercholesterolemia, unspecified: Secondary | ICD-10-CM | POA: Diagnosis not present

## 2023-11-15 DIAGNOSIS — K219 Gastro-esophageal reflux disease without esophagitis: Secondary | ICD-10-CM | POA: Diagnosis not present

## 2024-01-02 ENCOUNTER — Ambulatory Visit

## 2024-01-09 ENCOUNTER — Ambulatory Visit

## 2024-01-11 ENCOUNTER — Ambulatory Visit
Admission: RE | Admit: 2024-01-11 | Discharge: 2024-01-11 | Disposition: A | Discharge status: Home / Self Care | Source: Ambulatory Visit | Attending: Family Medicine | Admitting: Family Medicine

## 2024-01-11 DIAGNOSIS — Z1231 Encounter for screening mammogram for malignant neoplasm of breast: Secondary | ICD-10-CM | POA: Diagnosis not present

## 2024-01-17 ENCOUNTER — Encounter: Payer: Self-pay | Admitting: Cardiovascular Disease

## 2024-01-17 ENCOUNTER — Ambulatory Visit: Attending: Cardiovascular Disease | Admitting: Cardiovascular Disease

## 2024-01-17 DIAGNOSIS — I350 Nonrheumatic aortic (valve) stenosis: Secondary | ICD-10-CM | POA: Diagnosis not present

## 2024-01-17 DIAGNOSIS — I5189 Other ill-defined heart diseases: Secondary | ICD-10-CM

## 2024-01-17 DIAGNOSIS — I340 Nonrheumatic mitral (valve) insufficiency: Secondary | ICD-10-CM | POA: Diagnosis not present

## 2024-01-17 DIAGNOSIS — I10 Essential (primary) hypertension: Secondary | ICD-10-CM

## 2024-01-17 DIAGNOSIS — K219 Gastro-esophageal reflux disease without esophagitis: Secondary | ICD-10-CM | POA: Diagnosis not present

## 2024-01-17 NOTE — Progress Notes (Unsigned)
 Patient ID: [name] [name], female   DOB: 01/13/1941, 83 y.o.   MRN: [id number]       HPI: [name] [name] is a 83 y.o. female who presents to the office for an 8 month cardiology evaluation.     [name] has a history of hypertension, hyperlipidemia, mild obesity and GERD. An echo Doppler study in 2012 performend after she experienced exertional shortness of while visiting her daughter in [place]  revealed normal systolic function with mild mitral annular calcification mild MR, mild TR, and mild aortic valve sclerosis. She has a history of hyperkalemia in the past.   In March 2015 a three-year follow-up echo Doppler study confirmed normal systolic function with an ejection fraction of 60-65%.  She had grade 1 diastolic dysfunction.  There was mild aortic and mitral insufficiency as well as tricuspid insufficiency.  There was mild dilatation of her left atrium.  Laboratory done at [facility] [address] on 07/04/2014 : Glucose was normal at 91.  She normal renal function with a BUN of 12, guarding 0.82.  LFTs were normal.  She is on vitamin D 2000 mg daily and her vitamin D level was normal at 38.5.  Lipid studies were good with a total cholesterol 153, triglycerides 83, HDL 54, LDL 82.  She has had left knee problems.  She has undergone an MRI and has seen Dr. Murrell Arrant.  In May 2017 she developed left lower extremity pain radiating to her calf.  She underwent lower extremity venous duplex imaging which was negative for DVT.  There was no reflux.  She denies any chest pain or shortness of breath.  She denies PND or orthopnea.  She denies palpitations.  When I saw her in December 2017 she was on amlodipine  5 mg and hydrochlorothiazide 12.5 mg for hypertension.  She is on simvastatin  20 mg and fish oil 2 g daily for hyperlipidemia.  She takes omeprazole 20 mg for GERD.  She takes 81 mg aspirin .  She has a remote history of kidney stones and has been without recurrence.  She has been seen by Ervin Heath, PA-C on  several occasions.  She had undergone an echo Doppler study June 2018 which revealed normal systolic and diastolic function.  EF was 60 to 65%.  There was mild aortic sclerosis with mild AR and mitral annular calcification.  In August 2019 she had complaints of occasional chest fluttering and her ECG showed frequent PACs.  At that time her amlodipine  was discontinued and she was started on metoprolol  tartrate.  She was last seen by Ervin Heath, PAC 2020.  Due to development of intermittent left-sided chest pain, she was referred for a Lexiscan  Myoview  study.  This was done on Jan 11, 2019 and revealed normal myocardial perfusion.  LV function was hyperdynamic.  I saw her in August 2020 at which time she remained stable without chest pain or palpitations.  She remained active.  She will be having her fifth great grandchild.  She was walking at least 3 days/week and denied any exertional symptomatology.   She was evaluated by Ervin Heath in August 2021 and remained stable.  I saw her on October 16, 2020.  At that time she remained stable and was turning 80 at the end of the month.  Her blood pressure was stable and she was not having any palpitations.  I recommended she undergo a follow-up echo Doppler study for reassessment of her aortic stenosis and this was done on November 11, 2020.  LV function remains  normal at 60 to 65% EF and she had grade 2 diastolic dysfunction.  She continued to have mild aortic valve stenosis with a mean gradient of 13 mm, and peak gradient of 22.7 mm with an estimated aortic valve area of 1.99 cm.  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    Over the past year she has remained stable.  She apparently had developed some musculoskeletal neck shoulder discomfort which led to an ER evaluation at [facility] on December 10, 2021.  She was felt to have musculoskeletal etiology to her discomfort.  Troponins were negative.  Laboratory was normal with the exception of low  potassium at 3.4.  I saw her on Jan 05, 2022. Presently, she feels well.  Her neck shoulder discomfort has resolved.  She is unaware of palpitations.  She continues to be on HCTZ 12.5 mg and metoprolol  tartrate 25 mg twice a day for blood pressure.  She has not had recent palpitations.  She is on pantoprazole  for GERD.  She takes simvastatin  20 mg for hyperlipidemia.  Lipid studies done by her primary physician on November 04 2021 showed total cholesterol 141, HDL 56, LDL 71 and triglycerides 70.    I last saw her on May 16, 2023. Since her prior evaluation she has been without chest pain shortness of breath palpitations presyncope or syncope.  She has been followed by Dr. Darcey Earthly for primary care.  Laboratory on November 14, 2022 showed total cholesterol 130, HDL 50, LDL 63 and triglycerides 85.  Creatinine was 0.88.  She has been on hydrochlorothiazide 12.5 mg daily, and metoprolol  to tartrate 25 mg twice a day.  She also has been on simvastatin  20 mg daily for hyperlipidemia.  She has taken a baby aspirin .  She underwent lower extremity arterial Doppler studies on December 09, 2022 which showed normal ankle brachial index bilaterally.  Apparently she had swelling of her left leg in August 2024 presented to the ER.  Lower extremity venous Doppler study was negative for DVT.  Presently, she feels well.   Since I last saw her, she underwent a follow-up echo Doppler study on October 18, 2023.  This showed normal LV function with EF estimated 65 to 70% without wall motion abnormality.  There was grade 1 relaxation impairment.  Her aortic valve was calcified and she was now felt to have moderately severe aortic stenosis with a mean gradient of 21, peak gradient of 33.4, and estimated valve area at 0.99 cm which had progressed since her prior evaluation.  She states her blood pressure at home typically is around 120/70.  She denies any chest pain.  She denies any exertional dyspnea and is not aware of any presyncope  or syncope.  She continues to be on HCTZ 12.5 mg and metoprolol  tartrate 25 mg twice a day for blood pressure control.  She is on simvastatin  20 mg for lipid management and aspirin .  She presents for evaluation.   Past Medical History:  Diagnosis Date   Arthritis    Chronic knee pain    Diverticulosis 2008   GERD (gastroesophageal reflux disease)    History of stress test 10/2010   which she execised a 7 met workload achieving a peak heart rate of 141, representing 93% of predicted maximum. Scintigraphic images revealed ormal perfusion.   Hx of echocardiogram 10/2010   which revealed normal systolic as well as diastolic function. she did have mild mitral annular calcification with mild  MR and TR. She had mild aortic sclerosis without stenosis.   Hyperlipidemia    Hypertension    Kidney stones    Kidney stones    Obesity    Vitamin D deficiency     Past Surgical History:  Procedure Laterality Date   CHONDROPLASTY  08/19/2016   Procedure: CHONDROPLASTY patellar femoral joint, medial femoral condyle;  Surgeon: Neil Balls, MD;  Location: [facility];  Service: Orthopedics;;   KIDNEY STONE SURGERY     KNEE ARTHROSCOPY WITH EXCISION PLICA Left 08/19/2016   Procedure: KNEE ARTHROSCOPY WITH EXCISION PLICA;  Surgeon: Neil Balls, MD;  Location: [facility];  Service: Orthopedics;  Laterality: Left;   KNEE ARTHROSCOPY WITH MEDIAL MENISECTOMY Left 08/19/2016   Procedure: KNEE ARTHROSCOPY WITH MEDIAL MENISECTOMY;  Surgeon: Neil Balls, MD;  Location: [facility];  Service: Orthopedics;  Laterality: Left;   Orthroscopic Rt Knee      Allergies  Allergen Reactions   Crestor [Rosuvastatin Calcium ] Other (See Comments)    Leg pain    Current Outpatient Medications  Medication Sig Dispense Refill   acetaminophen  (TYLENOL ) 325 MG tablet Take 325 mg by mouth every 6 (six) hours as needed for mild pain or moderate pain.     aspirin  EC 81 MG tablet Take 81 mg  by mouth daily.     Calcium  Carb-Cholecalciferol (CALTRATE 600+D3) 600-20 MG-MCG TABS Take 1 tablet by mouth daily.     metoprolol  tartrate (LOPRESSOR ) 25 MG tablet TAKE 1 TABLET (25 MG TOTAL) BY MOUTH 2 (TWO) TIMES DAILY. KEEP OV. 180 tablet 1   pantoprazole  (PROTONIX ) 40 MG tablet TAKE 1 TABLET BY MOUTH EVERY DAY 90 tablet 3   simvastatin  (ZOCOR ) 20 MG tablet Take 20 mg by mouth daily.     Calcium  Carbonate-Vit D-Min (CALTRATE 600+D PLUS PO) Take 1 capsule by mouth daily. (Patient not taking: Reported on 01/17/2024)     hydrochlorothiazide (HYDRODIURIL) 25 MG tablet Take 12.5 mg by mouth daily.  (Patient not taking: Reported on 01/17/2024)     No current facility-administered medications for this visit.    Social History   Socioeconomic History   Marital status: Widowed    Spouse name: Not on file   Number of children: 2   Years of education: Not on file   Highest education level: Not on file  Occupational History   Occupation: Retired  Tobacco Use   Smoking status: Never   Smokeless tobacco: Never  Vaping Use   Vaping status: Never Used  Substance and Sexual Activity   Alcohol use: Yes    Comment: wine occ   Drug use: No   Sexual activity: Not on file  Other Topics Concern   Not on file  Social History Narrative   Not on file   Social Drivers of Health   Financial Resource Strain: Not on file  Food Insecurity: Not on file  Transportation Needs: Not on file  Physical Activity: Not on file  Stress: Not on file  Social Connections: Not on file  Intimate Partner Violence: Not on file   Socially she is widowed and has 2 children 4 grandchildren 2 great-grandchildren. There is no tobacco or alcohol history.  Family History  Problem Relation Age of Onset   Heart disease Mother    Dementia Mother    Lung cancer Father    Stroke Maternal Grandmother    Heart disease Maternal Grandmother    Colon cancer Neg Hx    Esophageal cancer  Neg Hx    Rectal cancer Neg Hx     Stomach cancer Neg Hx     ROS General: Negative; No fevers, chills, or night sweats;  HEENT: Negative; No changes in vision or hearing, sinus congestion, difficulty swallowing Pulmonary: Negative; No cough, wheezing, shortness of breath, hemoptysis Cardiovascular: See HPI GI: Positive for GERD, stable; No nausea, vomiting, diarrhea, or abdominal pain GU: Negative; No dysuria, hematuria, or difficulty voiding Musculoskeletal: Positive for left knee discomfort Hematologic/Oncology: Negative; no easy bruising, bleeding Endocrine: Negative; no heat/cold intolerance; no diabetes Neuro: Negative; no changes in balance, headaches Skin: Negative; No rashes or skin lesions Psychiatric: Negative; No behavioral problems, depression Sleep: Negative; No snoring, daytime sleepiness, hypersomnolence, bruxism, restless legs, hypnogognic hallucinations, no cataplexy Other comprehensive 14 point system review is negative.  PE BP 130/74   Pulse 66   Ht 5\' 4"  (1.626 m)   Wt 163 lb (73.9 kg)   SpO2 93%   BMI 27.98 kg/m    Repeat blood pressure by me was 130/78  Wt Readings from Last 3 Encounters:  01/17/24 163 lb (73.9 kg)  05/16/23 167 lb 3.2 oz (75.8 kg)  03/24/23 169 lb 15.6 oz (77.1 kg)   General: Alert, oriented, no distress.  Skin: normal turgor, no rashes, warm and dry HEENT: Normocephalic, atraumatic. Pupils equal round and reactive to light; sclera anicteric; extraocular muscles intact;  Nose without nasal septal hypertrophy3 Mouth/Parynx benign; Mallinpatti scale Neck: No JVD, no carotid bruits; normal carotid upstroke Lungs: clear to ausculatation and percussion; no wheezing or rales Chest wall: without tenderness to palpitation Heart: PMI not displaced, RRR, s1 s2 normal, 2/6 mid peaking systolic murmur in aortic area and sternal border, no diastolic murmur, no rubs, gallops, thrills, or heaves Abdomen: soft, nontender; no hepatosplenomehaly, BS+; abdominal aorta nontender and not  dilated by palpation. Back: no CVA tenderness Pulses 2+ Musculoskeletal: full range of motion, normal strength, no joint deformities Extremities: no clubbing cyanosis or edema, Homan's sign negative  Neurologic: grossly nonfocal; Cranial nerves grossly wnl Psychologic: Normal mood and affect  EKG Interpretation Date/Time:  Wednesday January 17 2024 09:41:30 EDT Ventricular Rate:  66 PR Interval:  134 QRS Duration:  84 QT Interval:  414 QTC Calculation: 434 R Axis:   20  Text Interpretation: Normal sinus rhythm Normal ECG When compared with ECG of 16-May-2023 16:52, Questionable change in QRS axis Confirmed by Magnus Schuller ([id number]) on 01/17/2024 11:07:55 AM    May 16, 2023 ECG (independently read by me): NSR at 73  Jan 05, 2022 ECG (independently read by me): Normal sinus rhythm at 70 bpm.  October 16, 2020 ECG (independently read by me): Normal sinus rhythm at 73 with mild sinus arrhythmia.  Nonspecific T changes in V1 and V2, unchanged.  Normal intervals  December 2017 ECG (independently read by me): Normal sinus rhythm at 82 bpm.  No ectopy.  Normal intervals.  November 2016 ECG (independently read by me): Normal sinus rhythm at 77 bpm.  No significant ST changes.  Normal intervals.  November 2015 ECG (independently read by me): Normal sinus rhythm at 78 bpm.  Nonspecific T-wave abnormalities, not significant change.  QTc interval 446 ms.  September 2014 ECG: Sinus rhythm at 72 beats per minute;  nonspecific T changes  LABS:     Latest Ref Rng & Units 01/05/2022    9:27 AM 12/10/2021    6:00 PM 10/22/2020    8:54 AM  BMP  Glucose 70 - 99 mg/dL 629  93  97   BUN 8 - 27 mg/dL 16  17  16    Creatinine 0.57 - 1.00 mg/dL 1.61  0.96  0.45   BUN/Creat Ratio 12 - 28 16   18    Sodium 134 - 144 mmol/L 141  140  144   Potassium 3.5 - 5.2 mmol/L 4.0  3.4  4.4   Chloride 96 - 106 mmol/L 100  104  103   CO2 20 - 29 mmol/L 31  29  26    Calcium  8.7 - 10.3 mg/dL 9.2  9.0  9.7       Latest  Ref Rng & Units 10/22/2020    8:54 AM 04/12/2019    8:13 AM 03/15/2019    7:23 PM  Hepatic Function  Total Protein 6.0 - 8.5 g/dL 7.1  6.8  7.1   Albumin 3.7 - 4.7 g/dL 4.2  4.1  3.8   AST 0 - 40 IU/L 16  16  18    ALT 0 - 32 IU/L 10  8  13    Alk Phosphatase 44 - 121 IU/L 83  81  69   Total Bilirubin 0.0 - 1.2 mg/dL 0.5  0.6  1.1       Latest Ref Rng & Units 12/10/2021    6:00 PM 10/22/2020    8:54 AM 04/12/2019    8:13 AM  CBC  WBC 4.0 - 10.5 K/uL 7.1  5.9  5.6   Hemoglobin 12.0 - 15.0 g/dL 40.9  81.1  91.4   Hematocrit 36.0 - 46.0 % 39.5  41.9  41.2   Platelets 150 - 400 K/uL 144  197  178    Lab Results  Component Value Date   MCV 93.6 12/10/2021   MCV 94 10/22/2020   MCV 93 04/12/2019   Lab Results  Component Value Date   TSH 0.979 10/22/2020     Lipid Panel     Component Value Date/Time   CHOL 192 10/22/2020 0854   CHOL 181 05/20/2013 0909   TRIG 78 10/22/2020 0854   TRIG 86 05/20/2013 0909   HDL 53 10/22/2020 0854   HDL 49 05/20/2013 0909   CHOLHDL 3.6 10/22/2020 0854   CHOLHDL 2.8 11/29/2013 1034   VLDL 15 11/29/2013 1034   LDLCALC 125 (H) 10/22/2020 0854   LDLCALC 115 (H) 05/20/2013 0909    RADIOLOGY: No results found.  ECHO: 10/17/2020  1. Left ventricular ejection fraction, by estimation, is 65 to 70%. Left  ventricular ejection fraction by 3D volume is 66 %. The left ventricle has  normal function. The left ventricle has no regional wall motion  abnormalities. Left ventricular diastolic   parameters are consistent with Grade I diastolic dysfunction (impaired  relaxation). The average left ventricular global longitudinal strain is  -19.7 %. The global longitudinal strain is normal.   2. Right ventricular systolic function is normal. The right ventricular  size is normal. Tricuspid regurgitation signal is inadequate for assessing  PA pressure.   3. The mitral valve is abnormal. Trivial mitral valve regurgitation.   4. The aortic valve is tricuspid.  Aortic valve regurgitation is trivial.  Moderate to severe aortic valve stenosis. Aortic valve area, by VTI  measures 0.99 cm. Aortic valve mean gradient measures 21.0 mmHg. Aortic  valve Vmax measures 2.89 m/s. Peak  gradient 33.4 mmHg, DI 0.26. LVOT diameter 2.2 cm.   5. Aortic dilatation noted. There is borderline dilatation of the  ascending aorta, measuring 38 mm.   6. The inferior vena cava  is normal in size with greater than 50%  respiratory variability, suggesting right atrial pressure of 3 mmHg.   Comparison(s): Changes from prior study are noted. 09/19/2022: LVEF 60-65%,  mild AS - MG 12.8 mmHg.   IMPRESSION:  1. Primary hypertension   2. Nonrheumatic aortic valve stenosis   3. Mild mitral regurgitation   4. Grade I diastolic dysfunction   5. Gastroesophageal reflux disease without esophagitis     ASSESSMENT AND PLAN: Ms. Neu is a very pleasant 83  year-old female who has a history of hypertension, hyperlipidemia, mild obesity, GERD and aortic stenosis..  Remotely, she had experienced palpitations which led to changing her amlodipine  to metoprolol  with resolution.  She has documented mild aortic stenosis and her echo from November 12, 2021 continued to show mild AS with excellent LV function with EF at 60 to 65%, a mean gradient of 13 and peak gradient of almost 23 mmHg.  When I last saw her in May 2023 she had recently experienced some left shoulder and neck musculoskeletal discomfort which has resolved.  Her blood pressure was stable on her current regimen of low-dose HCTZ 12.5 mg and metoprolol .  Since I last saw her, she has continued to be followed by Dr. Darcey Earthly.  She underwent follow-up laboratory which showed total cholesterol 130 HDL 50 LDL 63 and triglycerides 85 on November 14, 2022.  Potassium was 4.1.  She underwent a follow-up echo Doppler study on October 18, 2023 which showed normal LV function with EF estimated at 65 to 70% without wall motion abnormality.  There was  grade 1 diastolic dysfunction.  She was now felt to at least have moderate to moderately severe aortic stenosis with a calcified valve, mean gradient of 21 peak gradient of 34 and her estimated AVA was now 0.99 cm.  Since her prior evaluation in February 2024 her aorta stenosis has progressed from a mean gradient 12.8, peak gradient 21.7 and AVA 1.22 cm.  Her blood pressure today is stable and she states blood pressure at home typically runs in the 120s with diastolic over the 70s.  She continues to be on metoprolol  tartrate 25 mg twice a day and HCTZ 12.5 mg.  ECG shows sinus rhythm at 66 bpm without ectopy.  She continues to be on simvastatin  20 mg per lipid management and laboratory done by Dr. Aisha Ali in April 2025 showed total cholesterol 139, HDL 51, LDL 70 and triglycerides 103.  She is aware of my imminent retirement.  With her aortic valve disease, I will transition her to the care of Dr. Arnoldo Lapping for follow-up evaluation in approximately 6 months or sooner as needed.  Millicent Ally, MD, [facility]  01/19/2024 4:14 PM

## 2024-01-17 NOTE — Patient Instructions (Addendum)
 Medication Instructions:   Your physician recommends that you continue on your current medications as directed. Please refer to the Current Medication list given to you today.   *If you need a refill on your cardiac medications before your next appointment, please call your pharmacy*   Lab Work: NONE ORDERED  TODAY    If you have labs (blood work) drawn today and your tests are completely normal, you will receive your results only by: MyChart Message (if you have MyChart) OR A paper copy in the mail If you have any lab test that is abnormal or we need to change your treatment, we will call you to review the results.   Testing/Procedures: Your physician has requested that you have an echocardiogram. Echocardiography is a painless test that uses sound waves to create images of your heart. It provides your doctor with information about the size and shape of your heart and how well your heart's chambers and valves are working. This procedure takes approximately one hour. There are no restrictions for this procedure. Please do NOT wear cologne, perfume, aftershave, or lotions (deodorant is allowed). Please arrive 15 minutes prior to your appointment time.  Please note: We ask at that you not bring children with you during ultrasound (echo/ vascular) testing. Due to room size and safety concerns, children are not allowed in the ultrasound rooms during exams. Our front office staff cannot provide observation of children in our lobby area while testing is being conducted. An adult accompanying a patient to their appointment will only be allowed in the ultrasound room at the discretion of the ultrasound technician under special circumstances. We apologize for any inconvenience.    Follow-Up: At [facility], you and your health needs are our priority.  As part of our continuing mission to provide you with exceptional heart care, our providers are all part of one team.  This team includes  your primary Cardiologist (physician) and Advanced Practice Providers or APPs (Physician Assistants and Nurse Practitioners) who all work together to provide you with the care you need, when you need it.   Your next appointment:    5 -6 month(s)   Provider:  DR. Arlester Ladd     We recommend signing up for the patient portal called "MyChart".  Sign up information is provided on this After Visit Summary.  MyChart is used to connect with patients for Virtual Visits (Telemedicine).  Patients are able to view lab/test results, encounter notes, upcoming appointments, etc.  Non-urgent messages can be sent to your provider as well.   To learn more about what you can do with MyChart, go to [email or web address].   Other Instructions

## 2024-01-19 ENCOUNTER — Encounter: Payer: Self-pay | Admitting: Cardiovascular Disease

## 2024-02-12 DIAGNOSIS — E7800 Pure hypercholesterolemia, unspecified: Secondary | ICD-10-CM | POA: Diagnosis not present

## 2024-02-12 DIAGNOSIS — I10 Essential (primary) hypertension: Secondary | ICD-10-CM | POA: Diagnosis not present

## 2024-03-14 DIAGNOSIS — I10 Essential (primary) hypertension: Secondary | ICD-10-CM | POA: Diagnosis not present

## 2024-03-14 DIAGNOSIS — E7800 Pure hypercholesterolemia, unspecified: Secondary | ICD-10-CM | POA: Diagnosis not present

## 2024-04-03 DIAGNOSIS — H6122 Impacted cerumen, left ear: Secondary | ICD-10-CM | POA: Diagnosis not present

## 2024-04-14 DIAGNOSIS — I10 Essential (primary) hypertension: Secondary | ICD-10-CM | POA: Diagnosis not present

## 2024-04-14 DIAGNOSIS — E7800 Pure hypercholesterolemia, unspecified: Secondary | ICD-10-CM | POA: Diagnosis not present

## 2024-04-23 DIAGNOSIS — M7750 Other enthesopathy of unspecified foot: Secondary | ICD-10-CM | POA: Diagnosis not present

## 2024-04-23 DIAGNOSIS — M79672 Pain in left foot: Secondary | ICD-10-CM | POA: Diagnosis not present

## 2024-05-22 DIAGNOSIS — W19XXXA Unspecified fall, initial encounter: Secondary | ICD-10-CM | POA: Diagnosis not present

## 2024-05-22 DIAGNOSIS — Z23 Encounter for immunization: Secondary | ICD-10-CM | POA: Diagnosis not present

## 2024-05-22 DIAGNOSIS — T07XXXA Unspecified multiple injuries, initial encounter: Secondary | ICD-10-CM | POA: Diagnosis not present

## 2024-07-23 ENCOUNTER — Ambulatory Visit (HOSPITAL_COMMUNITY)

## 2024-08-29 ENCOUNTER — Ambulatory Visit (HOSPITAL_COMMUNITY)
Admission: RE | Admit: 2024-08-29 | Discharge: 2024-08-29 | Disposition: A | Discharge status: Home / Self Care | Source: Ambulatory Visit | Attending: Internal Medicine | Admitting: Internal Medicine

## 2024-08-29 DIAGNOSIS — I350 Nonrheumatic aortic (valve) stenosis: Secondary | ICD-10-CM | POA: Insufficient documentation

## 2024-08-29 LAB — ECHOCARDIOGRAM COMPLETE
AR max vel: 1.08 cm2
AV Area VTI: 1.13 cm2
AV Area mean vel: 1.07 cm2
AV Mean grad: 25 mmHg
AV Peak grad: 46 mmHg
Ao pk vel: 3.39 m/s
Area-P 1/2: 3.99 cm2
P 1/2 time: 347 ms
S' Lateral: 2.9 cm

## 2024-09-01 ENCOUNTER — Ambulatory Visit: Payer: Self-pay | Admitting: Cardiovascular Disease
